# Patient Record
Sex: Female | Born: 2000
Health system: Southern US, Community
[De-identification: ages and names within clinical notes are randomized; demographics above are authoritative.]

## PROBLEM LIST (undated history)

## (undated) DIAGNOSIS — Z8489 Family history of other specified conditions: Secondary | ICD-10-CM

## (undated) DIAGNOSIS — T7840XA Allergy, unspecified, initial encounter: Secondary | ICD-10-CM

## (undated) DIAGNOSIS — T8859XA Other complications of anesthesia, initial encounter: Secondary | ICD-10-CM

## (undated) DIAGNOSIS — R51 Headache: Secondary | ICD-10-CM

## (undated) DIAGNOSIS — T4145XA Adverse effect of unspecified anesthetic, initial encounter: Secondary | ICD-10-CM

## (undated) DIAGNOSIS — R519 Headache, unspecified: Secondary | ICD-10-CM

## (undated) HISTORY — PX: CAUTERIZE INNER NOSE: SHX279

## (undated) HISTORY — DX: Allergy, unspecified, initial encounter: T78.40XA

## (undated) HISTORY — PX: TONSILLECTOMY: SUR1361

## (undated) HISTORY — PX: TYMPANOSTOMY TUBE PLACEMENT: SHX32

---
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
## 2022-02-12 ENCOUNTER — Ambulatory Visit: Payer: Managed Care, Other (non HMO) | Admitting: Nurse Practitioner

## 2022-02-12 ENCOUNTER — Encounter: Payer: Self-pay | Admitting: Nurse Practitioner

## 2022-02-12 VITALS — BP 126/82 | HR 115 | Temp 98.5°F | Wt 187.0 lb

## 2022-02-12 DIAGNOSIS — F419 Anxiety disorder, unspecified: Secondary | ICD-10-CM

## 2022-02-12 DIAGNOSIS — F32A Depression, unspecified: Secondary | ICD-10-CM | POA: Diagnosis not present

## 2022-02-12 MED ORDER — BUPROPION HCL ER (SR) 100 MG PO TB12: 100.0000 mg | ORAL_TABLET | Freq: Every day | ORAL | 0 refills | Status: DC

## 2022-02-12 MED ORDER — BUSPIRONE HCL 5 MG PO TABS: 5.0000 mg | ORAL_TABLET | Freq: Two times a day (BID) | ORAL | 0 refills | Status: DC | PRN

## 2022-02-12 NOTE — Assessment & Plan Note (Signed)
Chronic. Not well controlled.  Has a lot of medical concerns a going on.  Will add Wellbutrin to '100mg'$  daily.  Side effects and benefits of medication discussed during visit.  Will also add Buspar '5mg'$  BID PRN.  Side effects and benefits of medication discussed during visit today. Follow up in 1 month.  Call sooner if concerns arise.

## 2022-02-12 NOTE — Progress Notes (Signed)
BP 126/82   Pulse (!) 115   Temp 98.5 F (36.9 C) (Oral)   Wt 187 lb (84.8 kg)   SpO2 98%   BMI 34.20 kg/m    Subjective:    Patient ID: Katherine Ibarra, female    DOB: [birth year], 21 y.o.   MRN: [id number]  HPI: Katherine Ibarra is a 21 y.o. female  Chief Complaint  Patient presents with   Depression   DEPRESSION/ANXIETY Patient presents to clinic with complaints of depression and anxiety.  Has been seen at [facility] for Giant Cell Tumor and Neurofibromatosis.  Patient feels like her anxiety is worse.  She has a lot of highs and lows.   She was previously on Lexapro and Klonopin.  She is still taking Lexapro.  She was previously taking 1/2 tab maybe once a week.     Relevant past medical, surgical, family and social history reviewed and updated as indicated. Interim medical history since our last visit reviewed. Allergies and medications reviewed and updated.  Review of Systems  Psychiatric/Behavioral:  Positive for dysphoric mood. Negative for sleep disturbance and suicidal ideas. The patient is nervous/anxious.     Per HPI unless specifically indicated above     Objective:    BP 126/82   Pulse (!) 115   Temp 98.5 F (36.9 C) (Oral)   Wt 187 lb (84.8 kg)   SpO2 98%   BMI 34.20 kg/m   Wt Readings from Last 3 Encounters:  02/12/22 187 lb (84.8 kg) (96 %, Z= 1.75)*  01/06/21 162 lb (73.5 kg) (91 %, Z= 1.31)*  10/31/20 172 lb 2 oz (78.1 kg) (94 %, Z= 1.54)*   * Growth percentiles are based on CDC (Girls, 2-20 Years) data.    Physical Exam Vitals and nursing note reviewed.  Constitutional:      General: She is not in acute distress.    Appearance: Normal appearance. She is not ill-appearing, toxic-appearing or diaphoretic.  HENT:     Head: Normocephalic.     Right Ear: External ear normal.     Left Ear: External ear normal.     Nose: Nose normal.     Mouth/Throat:     Mouth: Mucous membranes are moist.     Pharynx: Oropharynx is clear.  Eyes:     General:         Right eye: No discharge.        Left eye: No discharge.     Extraocular Movements: Extraocular movements intact.     Conjunctiva/sclera: Conjunctivae normal.     Pupils: Pupils are equal, round, and reactive to light.  Cardiovascular:     Rate and Rhythm: Normal rate and regular rhythm.     Heart sounds: No murmur heard. Pulmonary:     Effort: Pulmonary effort is normal. No respiratory distress.     Breath sounds: Normal breath sounds. No wheezing or rales.  Musculoskeletal:     Cervical back: Normal range of motion and neck supple.  Skin:    General: Skin is warm and dry.     Capillary Refill: Capillary refill takes less than 2 seconds.  Neurological:     General: No focal deficit present.     Mental Status: She is alert and oriented to person, place, and time. Mental status is at baseline.  Psychiatric:        Mood and Affect: Mood normal.        Behavior: Behavior normal.  Thought Content: Thought content normal.        Judgment: Judgment normal.     Results for orders placed or performed in visit on 01/06/21  Rapid Strep Screen ([facility] ONLY)   Naso  Result Value Ref Range   Strep Gp A Ag, IA W/Reflex Negative Negative  Culture, Group A Strep   Naso  Result Value Ref Range   Strep A Culture Negative   Veritor Flu A/B Waived  Result Value Ref Range   Influenza A Negative Negative   Influenza B Negative Negative      Assessment & Plan:   Problem List Items Addressed This Visit       Other   Anxiety and depression - Primary    Chronic. Not well controlled.  Has a lot of medical concerns a going on.  Will add Wellbutrin to '100mg'$  daily.  Side effects and benefits of medication discussed during visit.  Will also add Buspar '5mg'$  BID PRN.  Side effects and benefits of medication discussed during visit today. Follow up in 1 month.  Call sooner if concerns arise.         Relevant Medications   buPROPion ER (WELLBUTRIN SR) 100 MG 12 hr tablet   busPIRone  (BUSPAR) 5 MG tablet     Follow up plan: Return in about 2 weeks (around 02/26/2022) for Depression/Anxiety FU.

## 2022-02-23 ENCOUNTER — Other Ambulatory Visit: Payer: Self-pay | Admitting: Nurse Practitioner

## 2022-02-23 NOTE — Telephone Encounter (Signed)
Requested medication (s) are due for refill today: No  Requested medication (s) are on the active medication list: Yes  Last refill:  02/12/22  Future visit scheduled: Yes  Notes to clinic:  Pharmacy requests 90 day supply.    Requested Prescriptions  Pending Prescriptions Disp Refills   busPIRone (BUSPAR) 5 MG tablet [Pharmacy Med Name: BUSPIRONE HCL 5 MG TABLET] 180 tablet 1    Sig: TAKE 1 TABLET BY MOUTH 2 TIMES DAILY AS NEEDED.     Psychiatry: Anxiolytics/Hypnotics - Non-controlled Passed - 02/23/2022  8:30 AM      Passed - Valid encounter within last 12 months    Recent Outpatient Visits           1 week ago Anxiety and depression   [facility] Jon Billings, NP   1 year ago Sore throat   [facility], Scheryl Darter, NP   1 year ago Immunization due   [facility] Jon Billings, NP   2 years ago Diarrhea, unspecified type   [facility], [place]   3 years ago Encounter for routine child health examination without abnormal findings   [facility], Lilia Argue, PA-C       Future Appointments             In 2 days Jon Billings, NP [facility], [place]

## 2022-02-24 NOTE — Progress Notes (Unsigned)
There were no vitals taken for this visit.   Subjective:    Patient ID: Katherine Ibarra, female    DOB: [birth year], 21 y.o.   MRN: [id number]  HPI: [name] is a 21 y.o. female  No chief complaint on file.  DEPRESSION/ANXIETY Patient presents to clinic with complaints of depression and anxiety.  Has been seen at [facility] for Giant Cell Tumor and Neurofibromatosis.  Patient feels like her anxiety is worse.  She has a lot of highs and lows.   She was previously on Lexapro and Klonopin.  She is still taking Lexapro.  She was previously taking 1/2 tab maybe once a week.     Relevant past medical, surgical, family and social history reviewed and updated as indicated. Interim medical history since our last visit reviewed. Allergies and medications reviewed and updated.  Review of Systems  Psychiatric/Behavioral:  Positive for dysphoric mood. Negative for sleep disturbance and suicidal ideas. The patient is nervous/anxious.     Per HPI unless specifically indicated above     Objective:    There were no vitals taken for this visit.  Wt Readings from Last 3 Encounters:  02/12/22 187 lb (84.8 kg) (96 %, Z= 1.75)*  01/06/21 162 lb (73.5 kg) (91 %, Z= 1.31)*  10/31/20 172 lb 2 oz (78.1 kg) (94 %, Z= 1.54)*   * Growth percentiles are based on CDC (Girls, 2-20 Years) data.    Physical Exam Vitals and nursing note reviewed.  Constitutional:      General: She is not in acute distress.    Appearance: Normal appearance. She is not ill-appearing, toxic-appearing or diaphoretic.  HENT:     Head: Normocephalic.     Right Ear: External ear normal.     Left Ear: External ear normal.     Nose: Nose normal.     Mouth/Throat:     Mouth: Mucous membranes are moist.     Pharynx: Oropharynx is clear.  Eyes:     General:        Right eye: No discharge.        Left eye: No discharge.     Extraocular Movements: Extraocular movements intact.     Conjunctiva/sclera: Conjunctivae normal.      Pupils: Pupils are equal, round, and reactive to light.  Cardiovascular:     Rate and Rhythm: Normal rate and regular rhythm.     Heart sounds: No murmur heard. Pulmonary:     Effort: Pulmonary effort is normal. No respiratory distress.     Breath sounds: Normal breath sounds. No wheezing or rales.  Musculoskeletal:     Cervical back: Normal range of motion and neck supple.  Skin:    General: Skin is warm and dry.     Capillary Refill: Capillary refill takes less than 2 seconds.  Neurological:     General: No focal deficit present.     Mental Status: She is alert and oriented to person, place, and time. Mental status is at baseline.  Psychiatric:        Mood and Affect: Mood normal.        Behavior: Behavior normal.        Thought Content: Thought content normal.        Judgment: Judgment normal.    Results for orders placed or performed in visit on 01/06/21  Rapid Strep Screen ([facility] ONLY)   Naso  Result Value Ref Range   Strep Gp A Ag, IA W/Reflex  Negative Negative  Culture, Group A Strep   Naso  Result Value Ref Range   Strep A Culture Negative   Veritor Flu A/B Waived  Result Value Ref Range   Influenza A Negative Negative   Influenza B Negative Negative      Assessment & Plan:   Problem List Items Addressed This Visit       Other   Anxiety and depression - Primary     Follow up plan: No follow-ups on file.

## 2022-02-25 ENCOUNTER — Encounter: Payer: Self-pay | Admitting: Nurse Practitioner

## 2022-02-25 ENCOUNTER — Ambulatory Visit: Payer: Managed Care, Other (non HMO) | Admitting: Nurse Practitioner

## 2022-02-25 VITALS — BP 119/82 | HR 109 | Temp 98.4°F | Wt 187.2 lb

## 2022-02-25 DIAGNOSIS — F32A Depression, unspecified: Secondary | ICD-10-CM

## 2022-02-25 DIAGNOSIS — F419 Anxiety disorder, unspecified: Secondary | ICD-10-CM

## 2022-02-25 MED ORDER — BUPROPION HCL ER (XL) 150 MG PO TB24: 150.0000 mg | ORAL_TABLET | Freq: Every day | ORAL | 0 refills | Status: DC

## 2022-02-25 NOTE — Assessment & Plan Note (Signed)
Chronic. Improved.  Will change Wellbutrin to '150mg'$  XL to help with the symptoms.  Recommend taking at night to help with fatigue.  Continue with Lexapro and Buspar PRN.  Follow up in 1 month.  Call sooner if concerns arise.

## 2022-03-29 NOTE — Progress Notes (Signed)
BP 134/89   Pulse (!) 109   Temp 99.4 F (37.4 C) (Oral)   Ht '5\' 3"'$  (1.6 m)   Wt 191 lb 11.2 oz (87 kg)   LMP 03/09/2022 (Approximate)   SpO2 98%   BMI 33.96 kg/m    Subjective:    Patient ID: Katherine Ibarra, female    DOB: 04-Sep-2000, 22 y.o.   MRN: [id number]  HPI: Katherine Ibarra is a 22 y.o. female  Chief Complaint  Patient presents with   Anxiety   Depression   DEPRESSION/ANXIETY Patient presents to clinic with complaints of depression and anxiety.  Has been seen at [facility] for Giant Cell Tumor and Neurofibromatosis.  Patient feels like she isn't doing well.  The last month she has been crying a lot and very emotional.  Her fatigue goes in spurts.  Denies SI.  She does feel like a burden to her family. But doesn't have any thoughts of harming herself.     Relevant past medical, surgical, family and social history reviewed and updated as indicated. Interim medical history since our last visit reviewed. Allergies and medications reviewed and updated.  Review of Systems  Psychiatric/Behavioral:  Positive for dysphoric mood. Negative for sleep disturbance and suicidal ideas. The patient is nervous/anxious.     Per HPI unless specifically indicated above     Objective:    BP 134/89   Pulse (!) 109   Temp 99.4 F (37.4 C) (Oral)   Ht '5\' 3"'$  (1.6 m)   Wt 191 lb 11.2 oz (87 kg)   LMP 03/09/2022 (Approximate)   SpO2 98%   BMI 33.96 kg/m   Wt Readings from Last 3 Encounters:  03/30/22 191 lb 11.2 oz (87 kg) (97 %, Z= 1.82)*  02/25/22 187 lb 3.2 oz (84.9 kg) (96 %, Z= 1.75)*  02/12/22 187 lb (84.8 kg) (96 %, Z= 1.75)*   * Growth percentiles are based on CDC (Girls, 2-20 Years) data.    Physical Exam Vitals and nursing note reviewed.  Constitutional:      General: She is not in acute distress.    Appearance: Normal appearance. She is not ill-appearing, toxic-appearing or diaphoretic.  HENT:     Head: Normocephalic.     Right Ear: External ear normal.     Left  Ear: External ear normal.     Nose: Nose normal.     Mouth/Throat:     Mouth: Mucous membranes are moist.     Pharynx: Oropharynx is clear.  Eyes:     General:        Right eye: No discharge.        Left eye: No discharge.     Extraocular Movements: Extraocular movements intact.     Conjunctiva/sclera: Conjunctivae normal.     Pupils: Pupils are equal, round, and reactive to light.  Cardiovascular:     Rate and Rhythm: Normal rate and regular rhythm.     Heart sounds: No murmur heard. Pulmonary:     Effort: Pulmonary effort is normal. No respiratory distress.     Breath sounds: Normal breath sounds. No wheezing or rales.  Musculoskeletal:     Cervical back: Normal range of motion and neck supple.  Skin:    General: Skin is warm and dry.     Capillary Refill: Capillary refill takes less than 2 seconds.  Neurological:     General: No focal deficit present.     Mental Status: She is alert and oriented to person,  place, and time. Mental status is at baseline.  Psychiatric:        Mood and Affect: Mood normal.        Behavior: Behavior normal.        Thought Content: Thought content normal.        Judgment: Judgment normal.     Results for orders placed or performed in visit on 01/06/21  Rapid Strep Screen ([facility] ONLY)   Naso  Result Value Ref Range   Strep Gp A Ag, IA W/Reflex Negative Negative  Culture, Group A Strep   Naso  Result Value Ref Range   Strep A Culture Negative   Veritor Flu A/B Waived  Result Value Ref Range   Influenza A Negative Negative   Influenza B Negative Negative      Assessment & Plan:   Problem List Items Addressed This Visit       Other   Anxiety and depression - Primary    Chronic. Not well controlled.  Will increase dose of Wellbutrin to '300mg'$  XL daily.  Will refer to therapy.  Continue with Buspar and Lexapro.  Follow up in 1 month.  Call sooner if concerns arise.       Relevant Medications   buPROPion (WELLBUTRIN XL) 300  MG 24 hr tablet   Other Relevant Orders   Ambulatory referral to Psychology     Follow up plan: Return in about 1 month (around 04/30/2022) for Depression/Anxiety FU.

## 2022-03-30 ENCOUNTER — Ambulatory Visit: Payer: Managed Care, Other (non HMO) | Admitting: Nurse Practitioner

## 2022-03-30 ENCOUNTER — Encounter: Payer: Self-pay | Admitting: Nurse Practitioner

## 2022-03-30 VITALS — BP 134/89 | HR 109 | Temp 99.4°F | Ht 63.0 in | Wt 191.7 lb

## 2022-03-30 DIAGNOSIS — F419 Anxiety disorder, unspecified: Secondary | ICD-10-CM

## 2022-03-30 DIAGNOSIS — F32A Depression, unspecified: Secondary | ICD-10-CM | POA: Diagnosis not present

## 2022-03-30 MED ORDER — BUPROPION HCL ER (XL) 300 MG PO TB24: 300.0000 mg | ORAL_TABLET | Freq: Every day | ORAL | 0 refills | Status: DC

## 2022-03-30 NOTE — Assessment & Plan Note (Signed)
Chronic. Not well controlled.  Will increase dose of Wellbutrin to '300mg'$  XL daily.  Will refer to therapy.  Continue with Buspar and Lexapro.  Follow up in 1 month.  Call sooner if concerns arise.

## 2022-04-29 NOTE — Progress Notes (Signed)
BP 130/83   Pulse 99   Temp 98.8 F (37.1 C) (Oral)   Wt 202 lb 3.2 oz (91.7 kg)   LMP 04/21/2022 (Approximate)   SpO2 98%   BMI 35.82 kg/m    Subjective:    Patient ID: Katherine Ibarra, female    DOB: Jun 13, 2000, 22 y.o.   MRN: [id number]  HPI: Katherine Ibarra is a 22 y.o. female  No chief complaint on file.  DEPRESSION/ANXIETY Patient presents she is feeling much better.  She is seeing a counselor to clinic with complaints of depression and anxiety.   She likes the Wellbutrin and the Lexapro '20mg'$ .   She is also taking Buspar everyday in the afternoon.  She was started on Trazodone which is helping her sleep. Denies SI.    [facility] Visit from 04/30/2022 in [facility]  PHQ-9 Total Score 11         04/30/2022    8:31 AM 03/30/2022   10:23 AM 02/12/2022   11:42 AM  GAD 7 : Generalized Anxiety Score  Nervous, Anxious, on Edge '1 3 3  '$ Control/stop worrying '1 3 3  '$ Worry too much - different things '1 3 3  '$ Trouble relaxing '2 3 3  '$ Restless '1 2 2  '$ Easily annoyed or irritable '2 3 3  '$ Afraid - awful might happen '2 3 3  '$ Total GAD 7 Score '10 20 20  '$ Anxiety Difficulty Somewhat difficult Somewhat difficult Somewhat difficult     Relevant past medical, surgical, family and social history reviewed and updated as indicated. Interim medical history since our last visit reviewed. Allergies and medications reviewed and updated.  Review of Systems  Psychiatric/Behavioral:  Positive for dysphoric mood. Negative for sleep disturbance and suicidal ideas. The patient is nervous/anxious.     Per HPI unless specifically indicated above     Objective:    BP 130/83   Pulse 99   Temp 98.8 F (37.1 C) (Oral)   Wt 202 lb 3.2 oz (91.7 kg)   LMP 04/21/2022 (Approximate)   SpO2 98%   BMI 35.82 kg/m   Wt Readings from Last 3 Encounters:  04/30/22 202 lb 3.2 oz (91.7 kg) (98 %, Z= 1.98)*  03/30/22 191 lb 11.2 oz (87 kg) (97 %, Z= 1.82)*  02/25/22 187 lb  3.2 oz (84.9 kg) (96 %, Z= 1.75)*   * Growth percentiles are based on CDC (Girls, 2-20 Years) data.    Physical Exam Vitals and nursing note reviewed.  Constitutional:      General: She is not in acute distress.    Appearance: Normal appearance. She is not ill-appearing, toxic-appearing or diaphoretic.  HENT:     Head: Normocephalic.     Right Ear: External ear normal.     Left Ear: External ear normal.     Nose: Nose normal.     Mouth/Throat:     Mouth: Mucous membranes are moist.     Pharynx: Oropharynx is clear.  Eyes:     General:        Right eye: No discharge.        Left eye: No discharge.     Extraocular Movements: Extraocular movements intact.     Conjunctiva/sclera: Conjunctivae normal.     Pupils: Pupils are equal, round, and reactive to light.  Cardiovascular:     Rate and Rhythm: Normal rate and regular rhythm.     Heart sounds: No murmur heard. Pulmonary:     Effort:  Pulmonary effort is normal. No respiratory distress.     Breath sounds: Normal breath sounds. No wheezing or rales.  Musculoskeletal:     Cervical back: Normal range of motion and neck supple.  Skin:    General: Skin is warm and dry.     Capillary Refill: Capillary refill takes less than 2 seconds.  Neurological:     General: No focal deficit present.     Mental Status: She is alert and oriented to person, place, and time. Mental status is at baseline.  Psychiatric:        Mood and Affect: Mood normal.        Behavior: Behavior normal.        Thought Content: Thought content normal.        Judgment: Judgment normal.     Results for orders placed or performed in visit on 01/06/21  Rapid Strep Screen ([facility] ONLY)   Naso  Result Value Ref Range   Strep Gp A Ag, IA W/Reflex Negative Negative  Culture, Group A Strep   Naso  Result Value Ref Range   Strep A Culture Negative   Veritor Flu A/B Waived  Result Value Ref Range   Influenza A Negative Negative   Influenza B Negative  Negative      Assessment & Plan:   Problem List Items Addressed This Visit       Other   Anxiety and depression - Primary    Chronic.  Improved.  Doing well with Wellbutrin, Lexapro and Trazodone at bedtime.  Uses Buspar '5mg'$  in the afternoon.  Feels like it could be increased.  Will increase to Buspar '10mg'$ .  Follow up in 2 months.  Continue to see counselor.  Call sooner if concerns arise.       Relevant Medications   traZODone (DESYREL) 50 MG tablet   escitalopram (LEXAPRO) 20 MG tablet   buPROPion (WELLBUTRIN XL) 300 MG 24 hr tablet   busPIRone (BUSPAR) 5 MG tablet     Follow up plan: Return in about 2 months (around 06/29/2022) for Depression/Anxiety FU.

## 2022-04-30 ENCOUNTER — Ambulatory Visit: Payer: Managed Care, Other (non HMO) | Admitting: Nurse Practitioner

## 2022-04-30 ENCOUNTER — Encounter: Payer: Self-pay | Admitting: Nurse Practitioner

## 2022-04-30 VITALS — BP 130/83 | HR 99 | Temp 98.8°F | Wt 202.2 lb

## 2022-04-30 DIAGNOSIS — F32A Depression, unspecified: Secondary | ICD-10-CM | POA: Diagnosis not present

## 2022-04-30 DIAGNOSIS — F419 Anxiety disorder, unspecified: Secondary | ICD-10-CM | POA: Diagnosis not present

## 2022-04-30 MED ORDER — ESCITALOPRAM OXALATE 20 MG PO TABS: 20.0000 mg | ORAL_TABLET | Freq: Every day | ORAL | 1 refills | Status: DC

## 2022-04-30 MED ORDER — BUPROPION HCL ER (XL) 300 MG PO TB24: 300.0000 mg | ORAL_TABLET | Freq: Every day | ORAL | 1 refills | Status: DC

## 2022-04-30 MED ORDER — BUSPIRONE HCL 5 MG PO TABS: 10.0000 mg | ORAL_TABLET | Freq: Two times a day (BID) | ORAL | 1 refills | Status: DC

## 2022-04-30 NOTE — Assessment & Plan Note (Signed)
Chronic.  Improved.  Doing well with Wellbutrin, Lexapro and Trazodone at bedtime.  Uses Buspar '5mg'$  in the afternoon.  Feels like it could be increased.  Will increase to Buspar '10mg'$ .  Follow up in 2 months.  Continue to see counselor.  Call sooner if concerns arise.

## 2022-06-03 ENCOUNTER — Ambulatory Visit (INDEPENDENT_AMBULATORY_CARE_PROVIDER_SITE_OTHER): Payer: Managed Care, Other (non HMO) | Admitting: Nurse Practitioner

## 2022-06-03 ENCOUNTER — Encounter: Payer: Self-pay | Admitting: Nurse Practitioner

## 2022-06-03 VITALS — BP 119/85 | HR 89 | Temp 97.9°F | Ht 63.0 in | Wt 201.5 lb

## 2022-06-03 DIAGNOSIS — Q8501 Neurofibromatosis, type 1: Secondary | ICD-10-CM | POA: Diagnosis not present

## 2022-06-03 DIAGNOSIS — F32A Depression, unspecified: Secondary | ICD-10-CM

## 2022-06-03 DIAGNOSIS — F419 Anxiety disorder, unspecified: Secondary | ICD-10-CM | POA: Diagnosis not present

## 2022-06-03 DIAGNOSIS — Z1159 Encounter for screening for other viral diseases: Secondary | ICD-10-CM

## 2022-06-03 DIAGNOSIS — Z114 Encounter for screening for human immunodeficiency virus [HIV]: Secondary | ICD-10-CM

## 2022-06-03 DIAGNOSIS — Z0000 Encounter for general adult medical examination without abnormal findings: Secondary | ICD-10-CM | POA: Diagnosis not present

## 2022-06-03 DIAGNOSIS — Z23 Encounter for immunization: Secondary | ICD-10-CM

## 2022-06-03 DIAGNOSIS — R8290 Unspecified abnormal findings in urine: Secondary | ICD-10-CM

## 2022-06-03 DIAGNOSIS — Z136 Encounter for screening for cardiovascular disorders: Secondary | ICD-10-CM

## 2022-06-03 LAB — MICROSCOPIC EXAMINATION

## 2022-06-03 LAB — URINALYSIS, ROUTINE W REFLEX MICROSCOPIC
Bilirubin, UA: NEGATIVE
Glucose, UA: NEGATIVE
Ketones, UA: NEGATIVE
Nitrite, UA: POSITIVE — AB
Protein,UA: NEGATIVE
Specific Gravity, UA: >1.03 — ABNORMAL HIGH (ref 1.005–1.030)
Urobilinogen, Ur: 0.2 mg/dL (ref 0.2–1.0)
pH, UA: 5 (ref 5.0–7.5)

## 2022-06-03 NOTE — Progress Notes (Signed)
BP 119/85   Pulse 89   Temp 97.9 F (36.6 C) (Oral)   Ht 5\' 3"  (1.6 m)   Wt 201 lb 8 oz (91.4 kg)   LMP 05/13/2022 (Approximate)   SpO2 98%   BMI 35.69 kg/m    Subjective:    Patient ID: Katherine Ibarra, female    DOB: [birth year], 22 y.o.   MRN: [id number]  HPI: Katherine Ibarra is a 22 y.o. female presenting on 06/03/2022 for comprehensive medical examination. Current medical complaints include: depression/anxiety  She currently lives with: Mom, Dad and sister Menopausal Symptoms: no  Patient states her anxiety has been more of a problem recently than depression.  However, she feels like she is doing well and doesn't want to change her medications at this time.  Depression Screen done today and results listed below:     06/03/2022    8:54 AM 04/30/2022    8:31 AM 03/30/2022   10:23 AM 02/12/2022   11:41 AM 10/31/2020    3:33 PM  Depression screen PHQ 2/9  Decreased Interest 1 1 3 2  0  Down, Depressed, Hopeless 1 1 3 3  0  PHQ - 2 Score 2 2 6 5  0  Altered sleeping 0 1 3 3    Tired, decreased energy 0 2 3 3    Change in appetite 1 2 3 2    Feeling bad or failure about yourself  1 2 3 1    Trouble concentrating 0 1 3 0   Moving slowly or fidgety/restless 0 1 1 0   Suicidal thoughts 0 0 1 0   PHQ-9 Score 4 11 23 14    Difficult doing work/chores Not difficult at all Not difficult at all Somewhat difficult Somewhat difficult     The patient does a history of falls. I did complete a risk assessment for falls. A plan of care for falls was documented.   Past Medical History:  Past Medical History:  Diagnosis Date   Allergy    Seasonal   Complication of anesthesia    PT WOKE UP FROM TONSILLECTOMY VERY COMBATIVE PER MOM-PT WAS AGE 18   Family history of adverse reaction to anesthesia    MOM-N/V   Headache    MIGRAINES    Surgical History:  Past Surgical History:  Procedure Laterality Date   CAUTERIZE INNER NOSE     X4   EXCISION MASS LOWER EXTREMETIES Left 06/24/2016    Procedure: EXCISION MASS LOWER EXTREMETIES WITH BIOPSIES;  Surgeon: Clayburn Pert, MD;  Location: [facility] ORS;  Service: General;  Laterality: Left;   TONSILLECTOMY     AGE 18   TYMPANOSTOMY TUBE PLACEMENT      Medications:  Current Outpatient Medications on File Prior to Visit  Medication Sig   buPROPion (WELLBUTRIN XL) 300 MG 24 hr tablet Take 1 tablet (300 mg total) by mouth daily.   busPIRone (BUSPAR) 5 MG tablet Take 2 tablets (10 mg total) by mouth 2 (two) times daily.   escitalopram (LEXAPRO) 20 MG tablet Take 1 tablet (20 mg total) by mouth daily.   gabapentin (NEURONTIN) 300 MG capsule Take 300 mg by mouth 3 (three) times daily.   ibuprofen (ADVIL,MOTRIN) 200 MG tablet Take 200 mg by mouth every 6 (six) hours as needed for headache or moderate pain.   traZODone (DESYREL) 50 MG tablet Take 50 mg by mouth at bedtime.   No current facility-administered medications on file prior to visit.    Allergies:  No Known Allergies  Social  History:  Social History   Socioeconomic History   Marital status: Single    Spouse name: Not on file   Number of children: Not on file   Years of education: Not on file   Highest education level: Not on file  Occupational History   Not on file  Tobacco Use   Smoking status: Never    Passive exposure: Yes   Smokeless tobacco: Never   Tobacco comments:    Mom and dad smoke outside  Vaping Use   Vaping Use: Never used  Substance and Sexual Activity   Alcohol use: No    Alcohol/week: 0.0 standard drinks of alcohol   Drug use: No   Sexual activity: Never  Other Topics Concern   Not on file  Social History Narrative   Not on file   Social Determinants of Health   Financial Resource Strain: Not on file  Food Insecurity: Not on file  Transportation Needs: Not on file  Physical Activity: Not on file  Stress: Not on file  Social Connections: Not on file  Intimate Partner Violence: Not on file   Social History   Tobacco Use  Smoking  Status Never   Passive exposure: Yes  Smokeless Tobacco Never  Tobacco Comments   Mom and dad smoke outside   Social History   Substance and Sexual Activity  Alcohol Use No   Alcohol/week: 0.0 standard drinks of alcohol    Family History:  Family History  Problem Relation Age of Onset   Migraines Mother    Migraines Father     Past medical history, surgical history, medications, allergies, family history and social history reviewed with patient today and changes made to appropriate areas of the chart.   Review of Systems  Psychiatric/Behavioral:  Positive for depression. Negative for suicidal ideas. The patient is nervous/anxious.    All other ROS negative except what is listed above and in the HPI.      Objective:    BP 119/85   Pulse 89   Temp 97.9 F (36.6 C) (Oral)   Ht 5\' 3"  (1.6 m)   Wt 201 lb 8 oz (91.4 kg)   LMP 05/13/2022 (Approximate)   SpO2 98%   BMI 35.69 kg/m   Wt Readings from Last 3 Encounters:  06/03/22 201 lb 8 oz (91.4 kg) (98 %, Z= 1.97)*  04/30/22 202 lb 3.2 oz (91.7 kg) (98 %, Z= 1.98)*  03/30/22 191 lb 11.2 oz (87 kg) (97 %, Z= 1.82)*   * Growth percentiles are based on CDC (Girls, 2-20 Years) data.    Physical Exam Vitals and nursing note reviewed.  Constitutional:      General: She is awake. She is not in acute distress.    Appearance: Normal appearance. She is well-developed. She is not ill-appearing.  HENT:     Head: Normocephalic and atraumatic.     Right Ear: Hearing, tympanic membrane, ear canal and external ear normal. No drainage.     Left Ear: Hearing, tympanic membrane, ear canal and external ear normal. No drainage.     Nose: Nose normal.     Right Sinus: No maxillary sinus tenderness or frontal sinus tenderness.     Left Sinus: No maxillary sinus tenderness or frontal sinus tenderness.     Mouth/Throat:     Mouth: Mucous membranes are moist.     Pharynx: Oropharynx is clear. Uvula midline. No pharyngeal swelling,  oropharyngeal exudate or posterior oropharyngeal erythema.  Eyes:  General: Lids are normal.        Right eye: No discharge.        Left eye: No discharge.     Extraocular Movements: Extraocular movements intact.     Conjunctiva/sclera: Conjunctivae normal.     Pupils: Pupils are equal, round, and reactive to light.     Visual Fields: Right eye visual fields normal and left eye visual fields normal.  Neck:     Thyroid: No thyromegaly.     Vascular: No carotid bruit.     Trachea: Trachea normal.  Cardiovascular:     Rate and Rhythm: Normal rate and regular rhythm.     Heart sounds: Normal heart sounds. No murmur heard.    No gallop.  Pulmonary:     Effort: Pulmonary effort is normal. No accessory muscle usage or respiratory distress.     Breath sounds: Normal breath sounds.  Chest:  Breasts:    Right: Normal.     Left: Normal.  Abdominal:     General: Bowel sounds are normal.     Palpations: Abdomen is soft. There is no hepatomegaly or splenomegaly.     Tenderness: There is no abdominal tenderness.  Musculoskeletal:        General: Normal range of motion.     Cervical back: Normal range of motion and neck supple.     Right lower leg: No edema.     Left lower leg: No edema.  Lymphadenopathy:     Head:     Right side of head: No submental, submandibular, tonsillar, preauricular or posterior auricular adenopathy.     Left side of head: No submental, submandibular, tonsillar, preauricular or posterior auricular adenopathy.     Cervical: No cervical adenopathy.     Upper Body:     Right upper body: No supraclavicular, axillary or pectoral adenopathy.     Left upper body: No supraclavicular, axillary or pectoral adenopathy.  Skin:    General: Skin is warm and dry.     Capillary Refill: Capillary refill takes less than 2 seconds.     Findings: No rash.  Neurological:     Mental Status: She is alert and oriented to person, place, and time.     Gait: Gait is intact.   Psychiatric:        Attention and Perception: Attention normal.        Mood and Affect: Mood normal.        Speech: Speech normal.        Behavior: Behavior normal. Behavior is cooperative.        Thought Content: Thought content normal.        Judgment: Judgment normal.     Results for orders placed or performed in visit on 01/06/21  Rapid Strep Screen ([facility] ONLY)   Naso  Result Value Ref Range   Strep Gp A Ag, IA W/Reflex Negative Negative  Culture, Group A Strep   Naso  Result Value Ref Range   Strep A Culture Negative   Veritor Flu A/B Waived  Result Value Ref Range   Influenza A Negative Negative   Influenza B Negative Negative      Assessment & Plan:   Problem List Items Addressed This Visit       Nervous and Auditory   Neurofibromatosis, type 1 (HCC)    Chronic.  Followed by heme/onc specialist.  Had recent scan which gave good news.  Continue to follow up with specialist.  Other   Anxiety and depression    Chronic.  Controlled.  Doing well with Wellbutrin, Lexapro and Trazodone at bedtime.  Uses Buspar 10mg  in the afternoon.  Follow up in 3 months.  Continue to see counselor.  Call sooner if concerns arise.       Other Visit Diagnoses     Annual physical exam    -  Primary   Health maintenance reviewed during visit today.  Labs ordered.  HPV shot given.   Relevant Orders   CBC with Differential/Platelet   Comprehensive metabolic panel   Lipid panel   TSH   Urinalysis, Routine w reflex microscopic   HIV Antibody (routine testing w rflx)   Hepatitis C Antibody   Screening for HIV (human immunodeficiency virus)       Relevant Orders   HIV Antibody (routine testing w rflx)   Encounter for hepatitis C screening test for low risk patient       Relevant Orders   Hepatitis C Antibody   Screening for ischemic heart disease       Relevant Orders   Lipid panel   Need for HPV vaccination       Relevant Orders   HPV 9-valent  vaccine,Recombinat        Follow up plan: Return in about 3 months (around 09/03/2022) for HTN, HLD, DM2 FU.   LABORATORY TESTING:  - Pap smear: not applicable  IMMUNIZATIONS:   - Tdap: Tetanus vaccination status reviewed: last tetanus booster within 10 years. - Influenza: Refused - Pneumovax: Not applicable - Prevnar: Not applicable - COVID: Not applicable - HPV: Administered today - Shingrix vaccine: Not applicable  SCREENING: -Mammogram: Not applicable  - Colonoscopy: Not applicable  - Bone Density: Not applicable  -Hearing Test: Not applicable  -Spirometry: Not applicable   PATIENT COUNSELING:   Advised to take 1 mg of folate supplement per day if capable of pregnancy.   Sexuality: Discussed sexually transmitted diseases, partner selection, use of condoms, avoidance of unintended pregnancy  and contraceptive alternatives.   Advised to avoid cigarette smoking.  I discussed with the patient that most people either abstain from alcohol or drink within safe limits (<=14/week and <=4 drinks/occasion for males, <=7/weeks and <= 3 drinks/occasion for females) and that the risk for alcohol disorders and other health effects rises proportionally with the number of drinks per week and how often a drinker exceeds daily limits.  Discussed cessation/primary prevention of drug use and availability of treatment for abuse.   Diet: Encouraged to adjust caloric intake to maintain  or achieve ideal body weight, to reduce intake of dietary saturated fat and total fat, to limit sodium intake by avoiding high sodium foods and not adding table salt, and to maintain adequate dietary potassium and calcium preferably from fresh fruits, vegetables, and low-fat dairy products.    stressed the importance of regular exercise  Injury prevention: Discussed safety belts, safety helmets, smoke detector, smoking near bedding or upholstery.   Dental health: Discussed importance of regular tooth brushing,  flossing, and dental visits.    NEXT PREVENTATIVE PHYSICAL DUE IN 1 YEAR. Return in about 3 months (around 09/03/2022) for HTN, HLD, DM2 FU.

## 2022-06-03 NOTE — Assessment & Plan Note (Signed)
Chronic.  Followed by heme/onc specialist.  Had recent scan which gave good news.  Continue to follow up with specialist.

## 2022-06-03 NOTE — Assessment & Plan Note (Signed)
Chronic.  Controlled.  Doing well with Wellbutrin, Lexapro and Trazodone at bedtime.  Uses Buspar 10mg  in the afternoon.  Follow up in 3 months.  Continue to see counselor.  Call sooner if concerns arise.

## 2022-06-03 NOTE — Addendum Note (Signed)
Addended by: Jon Billings on: 06/03/2022 11:58 AM   Modules accepted: Orders

## 2022-06-04 LAB — COMPREHENSIVE METABOLIC PANEL
ALT: 19 IU/L (ref 0–32)
AST: 15 IU/L (ref 0–40)
Albumin/Globulin Ratio: 2.3 — ABNORMAL HIGH (ref 1.2–2.2)
Albumin: 4.4 g/dL (ref 4.0–5.0)
Alkaline Phosphatase: 84 IU/L (ref 42–106)
BUN/Creatinine Ratio: 22 (ref 9–23)
BUN: 18 mg/dL (ref 6–20)
Bilirubin Total: 0.3 mg/dL (ref 0.0–1.2)
CO2: 18 mmol/L — ABNORMAL LOW (ref 20–29)
Calcium: 9.2 mg/dL (ref 8.7–10.2)
Chloride: 106 mmol/L (ref 96–106)
Creatinine, Ser: 0.81 mg/dL (ref 0.57–1.00)
Globulin, Total: 1.9 g/dL (ref 1.5–4.5)
Glucose: 92 mg/dL (ref 70–99)
Potassium: 4.2 mmol/L (ref 3.5–5.2)
Sodium: 139 mmol/L (ref 134–144)
Total Protein: 6.3 g/dL (ref 6.0–8.5)
eGFR: 107 mL/min/{1.73_m2} (ref >59)

## 2022-06-04 LAB — LIPID PANEL
Chol/HDL Ratio: 2.8 ratio (ref 0.0–4.4)
Cholesterol, Total: 128 mg/dL (ref 100–169)
HDL: 46 mg/dL (ref >39)
LDL Chol Calc (NIH): 67 mg/dL (ref 0–109)
Triglycerides: 73 mg/dL (ref 0–89)
VLDL Cholesterol Cal: 15 mg/dL (ref 5–40)

## 2022-06-04 LAB — CBC WITH DIFFERENTIAL/PLATELET
Basophils Absolute: 0.1 10*3/uL (ref 0.0–0.2)
Basos: 1 %
EOS (ABSOLUTE): 0.1 10*3/uL (ref 0.0–0.4)
Eos: 1 %
Hematocrit: 42.4 % (ref 34.0–46.6)
Hemoglobin: 14.3 g/dL (ref 11.1–15.9)
Immature Grans (Abs): 0 10*3/uL (ref 0.0–0.1)
Immature Granulocytes: 0 %
Lymphocytes Absolute: 1.2 10*3/uL (ref 0.7–3.1)
Lymphs: 18 %
MCH: 32.1 pg (ref 26.6–33.0)
MCHC: 33.7 g/dL (ref 31.5–35.7)
MCV: 95 fL (ref 79–97)
Monocytes Absolute: 0.5 10*3/uL (ref 0.1–0.9)
Monocytes: 8 %
Neutrophils Absolute: 4.7 10*3/uL (ref 1.4–7.0)
Neutrophils: 72 %
Platelets: 201 10*3/uL (ref 150–450)
RBC: 4.46 x10E6/uL (ref 3.77–5.28)
RDW: 11.7 % (ref 11.7–15.4)
WBC: 6.6 10*3/uL (ref 3.4–10.8)

## 2022-06-04 LAB — HEPATITIS C ANTIBODY: Hep C Virus Ab: NONREACTIVE

## 2022-06-04 LAB — HIV ANTIBODY (ROUTINE TESTING W REFLEX): HIV Screen 4th Generation wRfx: NONREACTIVE

## 2022-06-04 LAB — TSH: TSH: 4.43 u[IU]/mL (ref 0.450–4.500)

## 2022-06-04 NOTE — Progress Notes (Signed)
Hi Katherine Ibarra. It was nice to see you yesterday.  Your lab work looks good.  No concerns at this time. Continue with your current medication regimen.  Follow up as discussed.  Please let me know if you have any questions.

## 2022-06-06 LAB — URINE CULTURE

## 2022-06-07 ENCOUNTER — Other Ambulatory Visit: Payer: Self-pay | Admitting: Family Medicine

## 2022-06-07 MED ORDER — NITROFURANTOIN MONOHYD MACRO 100 MG PO CAPS: 100.0000 mg | ORAL_CAPSULE | Freq: Two times a day (BID) | ORAL | 0 refills | Status: DC

## 2022-06-14 ENCOUNTER — Other Ambulatory Visit: Payer: Self-pay | Admitting: Nurse Practitioner

## 2022-06-15 NOTE — Telephone Encounter (Signed)
Requested Prescriptions  Pending Prescriptions Disp Refills   busPIRone (BUSPAR) 5 MG tablet [Pharmacy Med Name: BUSPIRONE HCL 5 MG TABLET] 180 tablet 1    Sig: TAKE 1 TABLET BY MOUTH TWICE A DAY AS NEEDED     Psychiatry: Anxiolytics/Hypnotics - Non-controlled Passed - 06/14/2022  4:26 PM      Passed - Valid encounter within last 12 months    Recent Outpatient Visits           1 week ago Annual physical exam   [place] [facility] Larae Grooms, NP   1 month ago Anxiety and depression   [place] [facility] Larae Grooms, NP   2 months ago Anxiety and depression   [place] [facility] Larae Grooms, NP   3 months ago Anxiety and depression   [place] [facility] Larae Grooms, NP   4 months ago Anxiety and depression   [place] [facility] Larae Grooms, NP       Future Appointments             In 2 weeks Larae Grooms, NP [place] [facility]   In 2 months Larae Grooms, NP [place] [facility]

## 2022-06-17 ENCOUNTER — Telehealth: Payer: Managed Care, Other (non HMO) | Admitting: Nurse Practitioner

## 2022-06-17 ENCOUNTER — Encounter: Payer: Self-pay | Admitting: Nurse Practitioner

## 2022-06-17 DIAGNOSIS — B379 Candidiasis, unspecified: Secondary | ICD-10-CM

## 2022-06-17 MED ORDER — FLUCONAZOLE 150 MG PO TABS: 150.0000 mg | ORAL_TABLET | Freq: Once | ORAL | 0 refills | Status: AC

## 2022-06-17 NOTE — Progress Notes (Signed)
LMP 05/13/2022 (Approximate)    Subjective:    Patient ID: Katherine Ibarra, female    DOB: 10/22/2000, 22 y.o.   MRN: [id number]  HPI: Katherine Ibarra is a 22 y.o. female  Chief Complaint  Patient presents with   Vaginitis    Recently given ABX, now having vaginal discomfort, started 4 days ago, has gotten worse   VAGINAL CONCERN Patient states she was recently given antibiotics and started having vaginal discomfort.  She is having itching but no discharge.  Relevant past medical, surgical, family and social history reviewed and updated as indicated. Interim medical history since our last visit reviewed. Allergies and medications reviewed and updated.  Review of Systems  Genitourinary:        Vaginal discomfort    Per HPI unless specifically indicated above     Objective:    LMP 05/13/2022 (Approximate)   Wt Readings from Last 3 Encounters:  06/03/22 201 lb 8 oz (91.4 kg) (98 %, Z= 1.97)*  04/30/22 202 lb 3.2 oz (91.7 kg) (98 %, Z= 1.98)*  03/30/22 191 lb 11.2 oz (87 kg) (97 %, Z= 1.82)*   * Growth percentiles are based on CDC (Girls, 2-20 Years) data.    Physical Exam Vitals and nursing note reviewed.  HENT:     Head: Normocephalic.     Right Ear: Hearing normal.     Left Ear: Hearing normal.     Nose: Nose normal.  Eyes:     Pupils: Pupils are equal, round, and reactive to light.  Pulmonary:     Effort: Pulmonary effort is normal. No respiratory distress.  Neurological:     Mental Status: She is alert.  Psychiatric:        Mood and Affect: Mood normal.        Behavior: Behavior normal.        Thought Content: Thought content normal.        Judgment: Judgment normal.     Results for orders placed or performed in visit on 06/03/22  Microscopic Examination   Urine  Result Value Ref Range   WBC, UA 6-10 (A) 0 - 5 /hpf   RBC, Urine 0-2 0 - 2 /hpf   Epithelial Cells (non renal) 0-10 0 - 10 /hpf   Bacteria, UA Many (A) None seen/Few  Urine Culture    Specimen: Urine   UR  Result Value Ref Range   Urine Culture, Routine Final report (A)    Organism ID, Bacteria Escherichia coli (A)    Antimicrobial Susceptibility Comment   CBC with Differential/Platelet  Result Value Ref Range   WBC 6.6 3.4 - 10.8 x10E3/uL   RBC 4.46 3.77 - 5.28 x10E6/uL   Hemoglobin 14.3 11.1 - 15.9 g/dL   Hematocrit 26.3 78.5 - 46.6 %   MCV 95 79 - 97 fL   MCH 32.1 26.6 - 33.0 pg   MCHC 33.7 31.5 - 35.7 g/dL   RDW 88.5 02.7 - 74.1 %   Platelets 201 150 - 450 x10E3/uL   Neutrophils 72 Not Estab. %   Lymphs 18 Not Estab. %   Monocytes 8 Not Estab. %   Eos 1 Not Estab. %   Basos 1 Not Estab. %   Neutrophils Absolute 4.7 1.4 - 7.0 x10E3/uL   Lymphocytes Absolute 1.2 0.7 - 3.1 x10E3/uL   Monocytes Absolute 0.5 0.1 - 0.9 x10E3/uL   EOS (ABSOLUTE) 0.1 0.0 - 0.4 x10E3/uL   Basophils Absolute 0.1 0.0 - 0.2 x10E3/uL  Immature Granulocytes 0 Not Estab. %   Immature Grans (Abs) 0.0 0.0 - 0.1 x10E3/uL  Comprehensive metabolic panel  Result Value Ref Range   Glucose 92 70 - 99 mg/dL   BUN 18 6 - 20 mg/dL   Creatinine, Ser 5.780.81 0.57 - 1.00 mg/dL   eGFR 469107 >62>59 XB/MWU/1.32mL/min/1.73   BUN/Creatinine Ratio 22 9 - 23   Sodium 139 134 - 144 mmol/L   Potassium 4.2 3.5 - 5.2 mmol/L   Chloride 106 96 - 106 mmol/L   CO2 18 (L) 20 - 29 mmol/L   Calcium 9.2 8.7 - 10.2 mg/dL   Total Protein 6.3 6.0 - 8.5 g/dL   Albumin 4.4 4.0 - 5.0 g/dL   Globulin, Total 1.9 1.5 - 4.5 g/dL   Albumin/Globulin Ratio 2.3 (H) 1.2 - 2.2   Bilirubin Total 0.3 0.0 - 1.2 mg/dL   Alkaline Phosphatase 84 42 - 106 IU/L   AST 15 0 - 40 IU/L   ALT 19 0 - 32 IU/L  Lipid panel  Result Value Ref Range   Cholesterol, Total 128 100 - 169 mg/dL   Triglycerides 73 0 - 89 mg/dL   HDL 46 >44>39 mg/dL   VLDL Cholesterol Cal 15 5 - 40 mg/dL   LDL Chol Calc (NIH) 67 0 - 109 mg/dL   Chol/HDL Ratio 2.8 0.0 - 4.4 ratio  TSH  Result Value Ref Range   TSH 4.430 0.450 - 4.500 uIU/mL  Urinalysis, Routine w reflex  microscopic  Result Value Ref Range   Specific Gravity, UA >1.030 (H) 1.005 - 1.030   pH, UA 5.0 5.0 - 7.5   Color, UA Yellow Yellow   Appearance Ur Cloudy (A) Clear   Leukocytes,UA Trace (A) Negative   Protein,UA Negative Negative/Trace   Glucose, UA Negative Negative   Ketones, UA Negative Negative   RBC, UA 1+ (A) Negative   Bilirubin, UA Negative Negative   Urobilinogen, Ur 0.2 0.2 - 1.0 mg/dL   Nitrite, UA Positive (A) Negative   Microscopic Examination See below:   HIV Antibody (routine testing w rflx)  Result Value Ref Range   HIV Screen 4th Generation wRfx Non Reactive Non Reactive  Hepatitis C Antibody  Result Value Ref Range   Hep C Virus Ab Non Reactive Non Reactive      Assessment & Plan:   Problem List Items Addressed This Visit   None Visit Diagnoses     Yeast infection    -  Primary   Will treat with Diflucan. Recommend taking 1 pill today and 1 pill 2 days after completing the antibiotics.   Relevant Medications   fluconazole (DIFLUCAN) 150 MG tablet        Follow up plan: No follow-ups on file.   This visit was completed via MyChart due to the restrictions of the COVID-19 pandemic. All issues as above were discussed and addressed. Physical exam was done as above through visual confirmation on MyChart. If it was felt that the patient should be evaluated in the office, they were directed there. The patient verbally consented to this visit. Location of the patient: Home Location of the provider: Office Those involved with this call:  Provider: Larae GroomsKaren Elke Holtry, NP CMA: Wilhemena DurieBrittany Russell, CMA Front Desk/Registration: Servando SnareIris Parrish This encounter was conducted via video.  I spent 20 dedicated to the care of this patient on the date of this encounter to include previsit review of symptoms, plan of care and follow up, face to face time with the  patient, and post visit ordering of testing.

## 2022-06-28 NOTE — Progress Notes (Deleted)
LMP 05/13/2022 (Approximate)    Subjective:    Patient ID: Katherine Ibarra, female    DOB: March 31, 2000, 22 y.o.   MRN: [id number]  HPI: Katherine Ibarra is a 22 y.o. female  No chief complaint on file.  DEPRESSION/ANXIETY Patient presents she is feeling much better.  She is seeing a counselor to clinic with complaints of depression and anxiety.   She likes the Wellbutrin and the Lexapro .   She is also taking Buspar everyday in the afternoon.  She was started on Trazodone which is helping her sleep. Denies SI.    Flowsheet Row Video Visit from 06/17/2022 in [facility]  PHQ-9 Total Score 7         06/17/2022    4:10 PM 06/03/2022    8:54 AM 04/30/2022    8:31 AM 03/30/2022   10:23 AM  GAD 7 : Generalized Anxiety Score  Nervous, Anxious, on Edge Control/stop worrying Worry too much - different things 0 Trouble relaxing Restless 0 0 1 2  Easily annoyed or irritable Afraid - awful might happen Total GAD 7 Score Anxiety Difficulty Somewhat difficult Somewhat difficult Somewhat difficult Somewhat difficult     Relevant past medical, surgical, family and social history reviewed and updated as indicated. Interim medical history since our last visit reviewed. Allergies and medications reviewed and updated.  Review of Systems  Psychiatric/Behavioral:  Positive for dysphoric mood. Negative for sleep disturbance and suicidal ideas. The patient is nervous/anxious.     Per HPI unless specifically indicated above     Objective:    LMP 05/13/2022 (Approximate)   Wt Readings from Last 3 Encounters:  06/03/22 201 lb 8 oz (91.4 kg) (98 %, Z= 1.97)*  04/30/22 202 lb 3.2 oz (91.7 kg) (98 %, Z= 1.98)*  03/30/22 191 lb 11.2 oz (87 kg) (97 %, Z= 1.82)*   * Growth percentiles are based on CDC (Girls, 2-20 Years) data.    Physical Exam Vitals and nursing note reviewed.  Constitutional:       General: She is not in acute distress.    Appearance: Normal appearance. She is not ill-appearing, toxic-appearing or diaphoretic.  HENT:     Head: Normocephalic.     Right Ear: External ear normal.     Left Ear: External ear normal.     Nose: Nose normal.     Mouth/Throat:     Mouth: Mucous membranes are moist.     Pharynx: Oropharynx is clear.  Eyes:     General:        Right eye: No discharge.        Left eye: No discharge.     Extraocular Movements: Extraocular movements intact.     Conjunctiva/sclera: Conjunctivae normal.     Pupils: Pupils are equal, round, and reactive to light.  Cardiovascular:     Rate and Rhythm: Normal rate and regular rhythm.     Heart sounds: No murmur heard. Pulmonary:     Effort: Pulmonary effort is normal. No respiratory distress.     Breath sounds: Normal breath sounds. No wheezing or rales.  Musculoskeletal:     Cervical back: Normal range of motion and neck supple.  Skin:    General: Skin is warm and dry.     Capillary  Refill: Capillary refill takes less than 2 seconds.  Neurological:     General: No focal deficit present.     Mental Status: She is alert and oriented to person, place, and time. Mental status is at baseline.  Psychiatric:        Mood and Affect: Mood normal.        Behavior: Behavior normal.        Thought Content: Thought content normal.        Judgment: Judgment normal.    Results for orders placed or performed in visit on 06/03/22  Microscopic Examination   Urine  Result Value Ref Range   WBC, UA 6-10 (A) 0 - 5 /hpf   RBC, Urine 0-2 0 - 2 /hpf   Epithelial Cells (non renal) 0-10 0 - 10 /hpf   Bacteria, UA Many (A) None seen/Few  Urine Culture   Specimen: Urine   UR  Result Value Ref Range   Urine Culture, Routine Final report (A)    Organism ID, Bacteria Escherichia coli (A)    Antimicrobial Susceptibility Comment   CBC with Differential/Platelet  Result Value Ref Range   WBC 6.6 3.4 - 10.8 x10E3/uL   RBC  4.46 3.77 - 5.28 x10E6/uL   Hemoglobin 14.3 11.1 - 15.9 g/dL   Hematocrit 16.1 09.6 - 46.6 %   MCV 95 79 - 97 fL   MCH 32.1 26.6 - 33.0 pg   MCHC 33.7 31.5 - 35.7 g/dL   RDW 04.5 40.9 - 81.1 %   Platelets 201 150 - 450 x10E3/uL   Neutrophils 72 Not Estab. %   Lymphs 18 Not Estab. %   Monocytes 8 Not Estab. %   Eos 1 Not Estab. %   Basos 1 Not Estab. %   Neutrophils Absolute 4.7 1.4 - 7.0 x10E3/uL   Lymphocytes Absolute 1.2 0.7 - 3.1 x10E3/uL   Monocytes Absolute 0.5 0.1 - 0.9 x10E3/uL   EOS (ABSOLUTE) 0.1 0.0 - 0.4 x10E3/uL   Basophils Absolute 0.1 0.0 - 0.2 x10E3/uL   Immature Granulocytes 0 Not Estab. %   Immature Grans (Abs) 0.0 0.0 - 0.1 x10E3/uL  Comprehensive metabolic panel  Result Value Ref Range   Glucose 92 70 - 99 mg/dL   BUN 18 6 - 20 mg/dL   Creatinine, Ser 9.14 0.57 - 1.00 mg/dL   eGFR 782 >95 AO/ZHY/8.65   BUN/Creatinine Ratio 22 9 - 23   Sodium 139 134 - 144 mmol/L   Potassium 4.2 3.5 - 5.2 mmol/L   Chloride 106 96 - 106 mmol/L   CO2 18 (L) 20 - 29 mmol/L   Calcium 9.2 8.7 - 10.2 mg/dL   Total Protein 6.3 6.0 - 8.5 g/dL   Albumin 4.4 4.0 - 5.0 g/dL   Globulin, Total 1.9 1.5 - 4.5 g/dL   Albumin/Globulin Ratio 2.3 (H) 1.2 - 2.2   Bilirubin Total 0.3 0.0 - 1.2 mg/dL   Alkaline Phosphatase 84 42 - 106 IU/L   AST 15 0 - 40 IU/L   ALT 19 0 - 32 IU/L  Lipid panel  Result Value Ref Range   Cholesterol, Total 128 100 - 169 mg/dL   Triglycerides 73 0 - 89 mg/dL   HDL 46 >78 mg/dL   VLDL Cholesterol Cal 15 5 - 40 mg/dL   LDL Chol Calc (NIH) 67 0 - 109 mg/dL   Chol/HDL Ratio 2.8 0.0 - 4.4 ratio  TSH  Result Value Ref Range   TSH 4.430 0.450 - 4.500  uIU/mL  Urinalysis, Routine w reflex microscopic  Result Value Ref Range   Specific Gravity, UA >1.030 (H) 1.005 - 1.030   pH, UA 5.0 5.0 - 7.5   Color, UA Yellow Yellow   Appearance Ur Cloudy (A) Clear   Leukocytes,UA Trace (A) Negative   Protein,UA Negative Negative/Trace   Glucose, UA Negative Negative    Ketones, UA Negative Negative   RBC, UA 1+ (A) Negative   Bilirubin, UA Negative Negative   Urobilinogen, Ur 0.2 0.2 - 1.0 mg/dL   Nitrite, UA Positive (A) Negative   Microscopic Examination See below:   HIV Antibody (routine testing w rflx)  Result Value Ref Range   HIV Screen 4th Generation wRfx Non Reactive Non Reactive  Hepatitis C Antibody  Result Value Ref Range   Hep C Virus Ab Non Reactive Non Reactive      Assessment & Plan:   Problem List Items Addressed This Visit       Other   Anxiety and depression - Primary     Follow up plan: No follow-ups on file.

## 2022-06-29 ENCOUNTER — Ambulatory Visit: Payer: Managed Care, Other (non HMO) | Admitting: Nurse Practitioner

## 2022-06-29 DIAGNOSIS — F32A Depression, unspecified: Secondary | ICD-10-CM

## 2022-06-30 ENCOUNTER — Ambulatory Visit: Payer: Managed Care, Other (non HMO) | Admitting: Nurse Practitioner

## 2022-06-30 ENCOUNTER — Encounter: Payer: Self-pay | Admitting: Nurse Practitioner

## 2022-06-30 VITALS — BP 123/80 | HR 91 | Temp 98.6°F

## 2022-06-30 DIAGNOSIS — F32A Depression, unspecified: Secondary | ICD-10-CM | POA: Diagnosis not present

## 2022-06-30 DIAGNOSIS — F419 Anxiety disorder, unspecified: Secondary | ICD-10-CM | POA: Diagnosis not present

## 2022-06-30 NOTE — Progress Notes (Unsigned)
BP 123/80 (BP Location: Right Arm, Patient Position: Sitting)   Pulse 91   Temp 98.6 F (37 C)   LMP 05/13/2022 (Approximate)   SpO2 98%    Subjective:    Patient ID: Katherine Ibarra, female    DOB: 05-19-2000, 22 y.o.   MRN: [id number]  HPI: Katherine Ibarra is a 22 y.o. female  Chief Complaint  Patient presents with   Depression   DEPRESSION/ANXIETY Patient presents she is feeling much better.  She is seeing a counselor to clinic with complaints of depression and anxiety.   She likes the Wellbutrin and the Lexapro 20mg .   She is also taking Buspar everyday in the afternoon.  She was started on Trazodone which is helping her sleep. Denies SI.    Flowsheet Row Video Visit from 06/17/2022 in [facility]  PHQ-9 Total Score 7         06/17/2022    4:10 PM 06/03/2022    8:54 AM 04/30/2022    8:31 AM 03/30/2022   10:23 AM  GAD 7 : Generalized Anxiety Score  Nervous, Anxious, on Edge 1 1 1 3   Control/stop worrying 1 1 1 3   Worry too much - different things 0 1 1 3   Trouble relaxing 1 1 2 3   Restless 0 0 1 2  Easily annoyed or irritable 1 1 2 3   Afraid - awful might happen 1 1 2 3   Total GAD 7 Score 5 6 10 20   Anxiety Difficulty Somewhat difficult Somewhat difficult Somewhat difficult Somewhat difficult     Relevant past medical, surgical, family and social history reviewed and updated as indicated. Interim medical history since our last visit reviewed. Allergies and medications reviewed and updated.  Review of Systems  Psychiatric/Behavioral:  Positive for dysphoric mood. Negative for sleep disturbance and suicidal ideas. The patient is nervous/anxious.     Per HPI unless specifically indicated above     Objective:    BP 123/80 (BP Location: Right Arm, Patient Position: Sitting)   Pulse 91   Temp 98.6 F (37 C)   LMP 05/13/2022 (Approximate)   SpO2 98%   Wt Readings from Last 3 Encounters:  06/03/22 201 lb 8 oz (91.4 kg) (98 %, Z= 1.97)*   04/30/22 202 lb 3.2 oz (91.7 kg) (98 %, Z= 1.98)*  03/30/22 191 lb 11.2 oz (87 kg) (97 %, Z= 1.82)*   * Growth percentiles are based on CDC (Girls, 2-20 Years) data.    Physical Exam Vitals and nursing note reviewed.  Constitutional:      General: She is not in acute distress.    Appearance: Normal appearance. She is not ill-appearing, toxic-appearing or diaphoretic.  HENT:     Head: Normocephalic.     Right Ear: External ear normal.     Left Ear: External ear normal.     Nose: Nose normal.     Mouth/Throat:     Mouth: Mucous membranes are moist.     Pharynx: Oropharynx is clear.  Eyes:     General:        Right eye: No discharge.        Left eye: No discharge.     Extraocular Movements: Extraocular movements intact.     Conjunctiva/sclera: Conjunctivae normal.     Pupils: Pupils are equal, round, and reactive to light.  Cardiovascular:     Rate and Rhythm: Normal rate and regular rhythm.     Heart sounds: No murmur heard.  Pulmonary:     Effort: Pulmonary effort is normal. No respiratory distress.     Breath sounds: Normal breath sounds. No wheezing or rales.  Musculoskeletal:     Cervical back: Normal range of motion and neck supple.  Skin:    General: Skin is warm and dry.     Capillary Refill: Capillary refill takes less than 2 seconds.  Neurological:     General: No focal deficit present.     Mental Status: She is alert and oriented to person, place, and time. Mental status is at baseline.  Psychiatric:        Mood and Affect: Mood normal.        Behavior: Behavior normal.        Thought Content: Thought content normal.        Judgment: Judgment normal.     Results for orders placed or performed in visit on 06/03/22  Microscopic Examination   Urine  Result Value Ref Range   WBC, UA 6-10 (A) 0 - 5 /hpf   RBC, Urine 0-2 0 - 2 /hpf   Epithelial Cells (non renal) 0-10 0 - 10 /hpf   Bacteria, UA Many (A) None seen/Few  Urine Culture   Specimen: Urine   UR   Result Value Ref Range   Urine Culture, Routine Final report (A)    Organism ID, Bacteria Escherichia coli (A)    Antimicrobial Susceptibility Comment   CBC with Differential/Platelet  Result Value Ref Range   WBC 6.6 3.4 - 10.8 x10E3/uL   RBC 4.46 3.77 - 5.28 x10E6/uL   Hemoglobin 14.3 11.1 - 15.9 g/dL   Hematocrit 40.9 81.1 - 46.6 %   MCV 95 79 - 97 fL   MCH 32.1 26.6 - 33.0 pg   MCHC 33.7 31.5 - 35.7 g/dL   RDW 91.4 78.2 - 95.6 %   Platelets 201 150 - 450 x10E3/uL   Neutrophils 72 Not Estab. %   Lymphs 18 Not Estab. %   Monocytes 8 Not Estab. %   Eos 1 Not Estab. %   Basos 1 Not Estab. %   Neutrophils Absolute 4.7 1.4 - 7.0 x10E3/uL   Lymphocytes Absolute 1.2 0.7 - 3.1 x10E3/uL   Monocytes Absolute 0.5 0.1 - 0.9 x10E3/uL   EOS (ABSOLUTE) 0.1 0.0 - 0.4 x10E3/uL   Basophils Absolute 0.1 0.0 - 0.2 x10E3/uL   Immature Granulocytes 0 Not Estab. %   Immature Grans (Abs) 0.0 0.0 - 0.1 x10E3/uL  Comprehensive metabolic panel  Result Value Ref Range   Glucose 92 70 - 99 mg/dL   BUN 18 6 - 20 mg/dL   Creatinine, Ser 2.13 0.57 - 1.00 mg/dL   eGFR 086 >57 QI/ONG/2.95   BUN/Creatinine Ratio 22 9 - 23   Sodium 139 134 - 144 mmol/L   Potassium 4.2 3.5 - 5.2 mmol/L   Chloride 106 96 - 106 mmol/L   CO2 18 (L) 20 - 29 mmol/L   Calcium 9.2 8.7 - 10.2 mg/dL   Total Protein 6.3 6.0 - 8.5 g/dL   Albumin 4.4 4.0 - 5.0 g/dL   Globulin, Total 1.9 1.5 - 4.5 g/dL   Albumin/Globulin Ratio 2.3 (H) 1.2 - 2.2   Bilirubin Total 0.3 0.0 - 1.2 mg/dL   Alkaline Phosphatase 84 42 - 106 IU/L   AST 15 0 - 40 IU/L   ALT 19 0 - 32 IU/L  Lipid panel  Result Value Ref Range   Cholesterol, Total 128 100 - 169 mg/dL  Triglycerides 73 0 - 89 mg/dL   HDL 46 >16 mg/dL   VLDL Cholesterol Cal 15 5 - 40 mg/dL   LDL Chol Calc (NIH) 67 0 - 109 mg/dL   Chol/HDL Ratio 2.8 0.0 - 4.4 ratio  TSH  Result Value Ref Range   TSH 4.430 0.450 - 4.500 uIU/mL  Urinalysis, Routine w reflex microscopic  Result Value  Ref Range   Specific Gravity, UA >1.030 (H) 1.005 - 1.030   pH, UA 5.0 5.0 - 7.5   Color, UA Yellow Yellow   Appearance Ur Cloudy (A) Clear   Leukocytes,UA Trace (A) Negative   Protein,UA Negative Negative/Trace   Glucose, UA Negative Negative   Ketones, UA Negative Negative   RBC, UA 1+ (A) Negative   Bilirubin, UA Negative Negative   Urobilinogen, Ur 0.2 0.2 - 1.0 mg/dL   Nitrite, UA Positive (A) Negative   Microscopic Examination See below:   HIV Antibody (routine testing w rflx)  Result Value Ref Range   HIV Screen 4th Generation wRfx Non Reactive Non Reactive  Hepatitis C Antibody  Result Value Ref Range   Hep C Virus Ab Non Reactive Non Reactive      Assessment & Plan:   Problem List Items Addressed This Visit       Other   Anxiety and depression - Primary     Follow up plan: No follow-ups on file.

## 2022-07-01 NOTE — Assessment & Plan Note (Signed)
Chronic.  Controlled.  Continue with current medication regimen.  Patient has been having some drowsiness during the day.  Encouraged her to take Gabapentin at night.  If too sleepy can cut back on Trazodone.  Continue with lexapro, Buspar and Wellbutrin.  Return to clinic in 2 months for reevaluation.  Call sooner if concerns arise.

## 2022-08-31 ENCOUNTER — Ambulatory Visit: Payer: Managed Care, Other (non HMO) | Admitting: Nurse Practitioner

## 2022-08-31 ENCOUNTER — Encounter: Payer: Self-pay | Admitting: Nurse Practitioner

## 2022-08-31 VITALS — BP 106/71 | HR 90 | Temp 98.4°F | Wt 200.8 lb

## 2022-08-31 DIAGNOSIS — F32A Depression, unspecified: Secondary | ICD-10-CM

## 2022-08-31 DIAGNOSIS — F419 Anxiety disorder, unspecified: Secondary | ICD-10-CM | POA: Diagnosis not present

## 2022-08-31 MED ORDER — ESCITALOPRAM OXALATE 20 MG PO TABS: 20.0000 mg | ORAL_TABLET | Freq: Every day | ORAL | 1 refills | Status: DC

## 2022-08-31 MED ORDER — BUSPIRONE HCL 5 MG PO TABS: 5.0000 mg | ORAL_TABLET | Freq: Two times a day (BID) | ORAL | 1 refills | Status: DC | PRN

## 2022-08-31 MED ORDER — BUPROPION HCL ER (XL) 300 MG PO TB24: 300.0000 mg | ORAL_TABLET | Freq: Every day | ORAL | 1 refills | Status: DC

## 2022-08-31 NOTE — Progress Notes (Signed)
BP 106/71   Pulse 90   Temp 98.4 F (36.9 C) (Oral)   Wt 200 lb 12.8 oz (91.1 kg)   LMP 08/09/2022 (Approximate)   SpO2 98%   BMI 35.57 kg/m    Subjective:    Patient ID: Katherine Ibarra, female    DOB: [birth year], 22 y.o.   MRN: [id number]  HPI: Katherine Ibarra is a 22 y.o. female  Chief Complaint  Patient presents with   Anxiety   Depression   DEPRESSION/ANXIETY Patient presents she is feeling much better.  She is seeing a counselor to clinic with complaints of depression and anxiety monthly.   She likes the Wellbutrin and the Lexapro 20mg .   She is also taking Buspar everyday in the afternoon.  She was started on Trazodone which is helping her sleep. Denies SI.    Flowsheet Row Office Visit from 08/31/2022 in [facility]  PHQ-9 Total Score 9         08/31/2022    9:52 AM 06/30/2022    4:25 PM 06/17/2022    4:10 PM 06/03/2022    8:54 AM  GAD 7 : Generalized Anxiety Score  Nervous, Anxious, on Edge 2 1 1 1   Control/stop worrying 3 1 1 1   Worry too much - different things 3 1 0 1  Trouble relaxing 2 1 1 1   Restless 1 1 0 0  Easily annoyed or irritable 3 2 1 1   Afraid - awful might happen 2 1 1 1   Total GAD 7 Score 16 8 5 6   Anxiety Difficulty Somewhat difficult Not difficult at all Somewhat difficult Somewhat difficult     Relevant past medical, surgical, family and social history reviewed and updated as indicated. Interim medical history since our last visit reviewed. Allergies and medications reviewed and updated.  Review of Systems  Psychiatric/Behavioral:  Positive for dysphoric mood. Negative for sleep disturbance and suicidal ideas. The patient is nervous/anxious.        Having drowsiness    Per HPI unless specifically indicated above     Objective:    BP 106/71   Pulse 90   Temp 98.4 F (36.9 C) (Oral)   Wt 200 lb 12.8 oz (91.1 kg)   LMP 08/09/2022 (Approximate)   SpO2 98%   BMI 35.57 kg/m   Wt Readings from Last 3  Encounters:  08/31/22 200 lb 12.8 oz (91.1 kg) (97 %, Z= 1.95)*  06/03/22 201 lb 8 oz (91.4 kg) (98 %, Z= 1.97)*  04/30/22 202 lb 3.2 oz (91.7 kg) (98 %, Z= 1.98)*   * Growth percentiles are based on CDC (Girls, 2-20 Years) data.    Physical Exam Vitals and nursing note reviewed.  Constitutional:      General: She is not in acute distress.    Appearance: Normal appearance. She is not ill-appearing, toxic-appearing or diaphoretic.  HENT:     Head: Normocephalic.     Right Ear: External ear normal.     Left Ear: External ear normal.     Nose: Nose normal.     Mouth/Throat:     Mouth: Mucous membranes are moist.     Pharynx: Oropharynx is clear.  Eyes:     General:        Right eye: No discharge.        Left eye: No discharge.     Extraocular Movements: Extraocular movements intact.     Conjunctiva/sclera: Conjunctivae normal.     Pupils:  Pupils are equal, round, and reactive to light.  Cardiovascular:     Rate and Rhythm: Normal rate and regular rhythm.     Heart sounds: No murmur heard. Pulmonary:     Effort: Pulmonary effort is normal. No respiratory distress.     Breath sounds: Normal breath sounds. No wheezing or rales.  Musculoskeletal:     Cervical back: Normal range of motion and neck supple.  Skin:    General: Skin is warm and dry.     Capillary Refill: Capillary refill takes less than 2 seconds.  Neurological:     General: No focal deficit present.     Mental Status: She is alert and oriented to person, place, and time. Mental status is at baseline.  Psychiatric:        Mood and Affect: Mood normal.        Behavior: Behavior normal.        Thought Content: Thought content normal.        Judgment: Judgment normal.     Results for orders placed or performed in visit on 06/03/22  Microscopic Examination   Urine  Result Value Ref Range   WBC, UA 6-10 (A) 0 - 5 /hpf   RBC, Urine 0-2 0 - 2 /hpf   Epithelial Cells (non renal) 0-10 0 - 10 /hpf   Bacteria, UA  Many (A) None seen/Few  Urine Culture   Specimen: Urine   UR  Result Value Ref Range   Urine Culture, Routine Final report (A)    Organism ID, Bacteria Escherichia coli (A)    Antimicrobial Susceptibility Comment   CBC with Differential/Platelet  Result Value Ref Range   WBC 6.6 3.4 - 10.8 x10E3/uL   RBC 4.46 3.77 - 5.28 x10E6/uL   Hemoglobin 14.3 11.1 - 15.9 g/dL   Hematocrit 95.6 21.3 - 46.6 %   MCV 95 79 - 97 fL   MCH 32.1 26.6 - 33.0 pg   MCHC 33.7 31.5 - 35.7 g/dL   RDW 08.6 57.8 - 46.9 %   Platelets 201 150 - 450 x10E3/uL   Neutrophils 72 Not Estab. %   Lymphs 18 Not Estab. %   Monocytes 8 Not Estab. %   Eos 1 Not Estab. %   Basos 1 Not Estab. %   Neutrophils Absolute 4.7 1.4 - 7.0 x10E3/uL   Lymphocytes Absolute 1.2 0.7 - 3.1 x10E3/uL   Monocytes Absolute 0.5 0.1 - 0.9 x10E3/uL   EOS (ABSOLUTE) 0.1 0.0 - 0.4 x10E3/uL   Basophils Absolute 0.1 0.0 - 0.2 x10E3/uL   Immature Granulocytes 0 Not Estab. %   Immature Grans (Abs) 0.0 0.0 - 0.1 x10E3/uL  Comprehensive metabolic panel  Result Value Ref Range   Glucose 92 70 - 99 mg/dL   BUN 18 6 - 20 mg/dL   Creatinine, Ser 6.29 0.57 - 1.00 mg/dL   eGFR 528 >41 LK/GMW/1.02   BUN/Creatinine Ratio 22 9 - 23   Sodium 139 134 - 144 mmol/L   Potassium 4.2 3.5 - 5.2 mmol/L   Chloride 106 96 - 106 mmol/L   CO2 18 (L) 20 - 29 mmol/L   Calcium 9.2 8.7 - 10.2 mg/dL   Total Protein 6.3 6.0 - 8.5 g/dL   Albumin 4.4 4.0 - 5.0 g/dL   Globulin, Total 1.9 1.5 - 4.5 g/dL   Albumin/Globulin Ratio 2.3 (H) 1.2 - 2.2   Bilirubin Total 0.3 0.0 - 1.2 mg/dL   Alkaline Phosphatase 84 42 - 106 IU/L  AST 15 0 - 40 IU/L   ALT 19 0 - 32 IU/L  Lipid panel  Result Value Ref Range   Cholesterol, Total 128 100 - 169 mg/dL   Triglycerides 73 0 - 89 mg/dL   HDL 46 >16 mg/dL   VLDL Cholesterol Cal 15 5 - 40 mg/dL   LDL Chol Calc (NIH) 67 0 - 109 mg/dL   Chol/HDL Ratio 2.8 0.0 - 4.4 ratio  TSH  Result Value Ref Range   TSH 4.430 0.450 - 4.500  uIU/mL  Urinalysis, Routine w reflex microscopic  Result Value Ref Range   Specific Gravity, UA >1.030 (H) 1.005 - 1.030   pH, UA 5.0 5.0 - 7.5   Color, UA Yellow Yellow   Appearance Ur Cloudy (A) Clear   Leukocytes,UA Trace (A) Negative   Protein,UA Negative Negative/Trace   Glucose, UA Negative Negative   Ketones, UA Negative Negative   RBC, UA 1+ (A) Negative   Bilirubin, UA Negative Negative   Urobilinogen, Ur 0.2 0.2 - 1.0 mg/dL   Nitrite, UA Positive (A) Negative   Microscopic Examination See below:   HIV Antibody (routine testing w rflx)  Result Value Ref Range   HIV Screen 4th Generation wRfx Non Reactive Non Reactive  Hepatitis C Antibody  Result Value Ref Range   Hep C Virus Ab Non Reactive Non Reactive      Assessment & Plan:   Problem List Items Addressed This Visit       Other   Anxiety and depression - Primary    Chronic.  Controlled, despite PHQ9.  Continue with current medication regimen of Lexapro, Wellbutrin and Buspar.  Refills sent today.  Return to clinic in 4 months for reevaluation.  Call sooner if concerns arise.        Relevant Medications   buPROPion (WELLBUTRIN XL) 300 MG 24 hr tablet   busPIRone (BUSPAR) 5 MG tablet   escitalopram (LEXAPRO) 20 MG tablet     Follow up plan: Return in about 4 months (around 12/31/2022) for Depression/Anxiety FU.

## 2022-08-31 NOTE — Assessment & Plan Note (Addendum)
Chronic.  Controlled, despite PHQ9.  Continue with current medication regimen of Lexapro, Wellbutrin and Buspar.  Refills sent today.  Return to clinic in 4 months for reevaluation.  Call sooner if concerns arise.

## 2023-01-03 ENCOUNTER — Ambulatory Visit: Payer: Managed Care, Other (non HMO) | Admitting: Nurse Practitioner

## 2023-01-03 ENCOUNTER — Encounter: Payer: Self-pay | Admitting: Nurse Practitioner

## 2023-01-03 VITALS — BP 120/81 | HR 90 | Temp 97.8°F | Wt 199.6 lb

## 2023-01-03 DIAGNOSIS — F32A Depression, unspecified: Secondary | ICD-10-CM | POA: Diagnosis not present

## 2023-01-03 DIAGNOSIS — F419 Anxiety disorder, unspecified: Secondary | ICD-10-CM

## 2023-01-03 MED ORDER — ESCITALOPRAM OXALATE 20 MG PO TABS: 20.0000 mg | ORAL_TABLET | Freq: Every day | ORAL | 1 refills | Status: DC

## 2023-01-03 MED ORDER — BUPROPION HCL ER (XL) 300 MG PO TB24: 300.0000 mg | ORAL_TABLET | Freq: Every day | ORAL | 1 refills | Status: DC

## 2023-01-03 MED ORDER — BUSPIRONE HCL 5 MG PO TABS: 5.0000 mg | ORAL_TABLET | Freq: Two times a day (BID) | ORAL | 1 refills | Status: DC | PRN

## 2023-01-03 NOTE — Progress Notes (Signed)
BP 120/81   Pulse 90   Temp 97.8 F (36.6 C) (Oral)   Wt 199 lb 9.6 oz (90.5 kg)   LMP 01/02/2023 (Exact Date)   SpO2 99%   BMI 35.36 kg/m    Subjective:    Patient ID: Katherine Ibarra, female    DOB: [birth year], 22 y.o.   MRN: [id number]  HPI: Katherine Ibarra is a 22 y.o. female  Chief Complaint  Patient presents with   Anxiety   Depression   DEPRESSION/ANXIETY Patient presents she is feeling much better.  She feels like these are good doses of medication for her.  She is seeing a counselor to clinic with complaints of depression and anxiety monthly.   She likes the Wellbutrin and the Lexapro 20mg .   She is also taking Buspar everyday in the afternoon.  She was started on Trazodone which is helping her sleep. Denies SI.    Flowsheet Row Office Visit from 01/03/2023 in [facility]  PHQ-9 Total Score 11         01/03/2023   10:53 AM 08/31/2022    9:52 AM 06/30/2022    4:25 PM 06/17/2022    4:10 PM  GAD 7 : Generalized Anxiety Score  Nervous, Anxious, on Edge 2 2 1 1   Control/stop worrying 3 3 1 1   Worry too much - different things 3 3 1  0  Trouble relaxing 2 2 1 1   Restless 1 1 1  0  Easily annoyed or irritable 2 3 2 1   Afraid - awful might happen 3 2 1 1   Total GAD 7 Score 16 16 8 5   Anxiety Difficulty Somewhat difficult Somewhat difficult Not difficult at all Somewhat difficult   Patient is due to see the Oncologist this month.  So far everything   Relevant past medical, surgical, family and social history reviewed and updated as indicated. Interim medical history since our last visit reviewed. Allergies and medications reviewed and updated.  Review of Systems  Psychiatric/Behavioral:  Positive for dysphoric mood. Negative for sleep disturbance and suicidal ideas. The patient is nervous/anxious.        Having drowsiness    Per HPI unless specifically indicated above     Objective:    BP 120/81   Pulse 90   Temp 97.8 F (36.6 C)  (Oral)   Wt 199 lb 9.6 oz (90.5 kg)   LMP 01/02/2023 (Exact Date)   SpO2 99%   BMI 35.36 kg/m   Wt Readings from Last 3 Encounters:  01/03/23 199 lb 9.6 oz (90.5 kg)  08/31/22 200 lb 12.8 oz (91.1 kg) (97%, Z= 1.95)*  06/03/22 201 lb 8 oz (91.4 kg) (98%, Z= 1.97)*   * Growth percentiles are based on CDC (Girls, 2-20 Years) data.    Physical Exam Vitals and nursing note reviewed.  Constitutional:      General: She is not in acute distress.    Appearance: Normal appearance. She is not ill-appearing, toxic-appearing or diaphoretic.  HENT:     Head: Normocephalic.     Right Ear: External ear normal.     Left Ear: External ear normal.     Nose: Nose normal.     Mouth/Throat:     Mouth: Mucous membranes are moist.     Pharynx: Oropharynx is clear.  Eyes:     General:        Right eye: No discharge.        Left eye: No discharge.  Extraocular Movements: Extraocular movements intact.     Conjunctiva/sclera: Conjunctivae normal.     Pupils: Pupils are equal, round, and reactive to light.  Cardiovascular:     Rate and Rhythm: Normal rate and regular rhythm.     Heart sounds: No murmur heard. Pulmonary:     Effort: Pulmonary effort is normal. No respiratory distress.     Breath sounds: Normal breath sounds. No wheezing or rales.  Musculoskeletal:     Cervical back: Normal range of motion and neck supple.  Skin:    General: Skin is warm and dry.     Capillary Refill: Capillary refill takes less than 2 seconds.  Neurological:     General: No focal deficit present.     Mental Status: She is alert and oriented to person, place, and time. Mental status is at baseline.  Psychiatric:        Mood and Affect: Mood normal.        Behavior: Behavior normal.        Thought Content: Thought content normal.        Judgment: Judgment normal.     Results for orders placed or performed in visit on 06/03/22  Microscopic Examination   Urine  Result Value Ref Range   WBC, UA 6-10 (A) 0  - 5 /hpf   RBC, Urine 0-2 0 - 2 /hpf   Epithelial Cells (non renal) 0-10 0 - 10 /hpf   Bacteria, UA Many (A) None seen/Few  Urine Culture   Specimen: Urine   UR  Result Value Ref Range   Urine Culture, Routine Final report (A)    Organism ID, Bacteria Escherichia coli (A)    Antimicrobial Susceptibility Comment   CBC with Differential/Platelet  Result Value Ref Range   WBC 6.6 3.4 - 10.8 x10E3/uL   RBC 4.46 3.77 - 5.28 x10E6/uL   Hemoglobin 14.3 11.1 - 15.9 g/dL   Hematocrit 29.5 28.4 - 46.6 %   MCV 95 79 - 97 fL   MCH 32.1 26.6 - 33.0 pg   MCHC 33.7 31.5 - 35.7 g/dL   RDW 13.2 44.0 - 10.2 %   Platelets 201 150 - 450 x10E3/uL   Neutrophils 72 Not Estab. %   Lymphs 18 Not Estab. %   Monocytes 8 Not Estab. %   Eos 1 Not Estab. %   Basos 1 Not Estab. %   Neutrophils Absolute 4.7 1.4 - 7.0 x10E3/uL   Lymphocytes Absolute 1.2 0.7 - 3.1 x10E3/uL   Monocytes Absolute 0.5 0.1 - 0.9 x10E3/uL   EOS (ABSOLUTE) 0.1 0.0 - 0.4 x10E3/uL   Basophils Absolute 0.1 0.0 - 0.2 x10E3/uL   Immature Granulocytes 0 Not Estab. %   Immature Grans (Abs) 0.0 0.0 - 0.1 x10E3/uL  Comprehensive metabolic panel  Result Value Ref Range   Glucose 92 70 - 99 mg/dL   BUN 18 6 - 20 mg/dL   Creatinine, Ser 7.25 0.57 - 1.00 mg/dL   eGFR 366 >44 IH/KVQ/2.59   BUN/Creatinine Ratio 22 9 - 23   Sodium 139 134 - 144 mmol/L   Potassium 4.2 3.5 - 5.2 mmol/L   Chloride 106 96 - 106 mmol/L   CO2 18 (L) 20 - 29 mmol/L   Calcium 9.2 8.7 - 10.2 mg/dL   Total Protein 6.3 6.0 - 8.5 g/dL   Albumin 4.4 4.0 - 5.0 g/dL   Globulin, Total 1.9 1.5 - 4.5 g/dL   Albumin/Globulin Ratio 2.3 (H) 1.2 - 2.2   Bilirubin  Total 0.3 0.0 - 1.2 mg/dL   Alkaline Phosphatase 84 42 - 106 IU/L   AST 15 0 - 40 IU/L   ALT 19 0 - 32 IU/L  Lipid panel  Result Value Ref Range   Cholesterol, Total 128 100 - 169 mg/dL   Triglycerides 73 0 - 89 mg/dL   HDL 46 >81 mg/dL   VLDL Cholesterol Cal 15 5 - 40 mg/dL   LDL Chol Calc (NIH) 67 0 - 109  mg/dL   Chol/HDL Ratio 2.8 0.0 - 4.4 ratio  TSH  Result Value Ref Range   TSH 4.430 0.450 - 4.500 uIU/mL  Urinalysis, Routine w reflex microscopic  Result Value Ref Range   Specific Gravity, UA >1.030 (H) 1.005 - 1.030   pH, UA 5.0 5.0 - 7.5   Color, UA Yellow Yellow   Appearance Ur Cloudy (A) Clear   Leukocytes,UA Trace (A) Negative   Protein,UA Negative Negative/Trace   Glucose, UA Negative Negative   Ketones, UA Negative Negative   RBC, UA 1+ (A) Negative   Bilirubin, UA Negative Negative   Urobilinogen, Ur 0.2 0.2 - 1.0 mg/dL   Nitrite, UA Positive (A) Negative   Microscopic Examination See below:   HIV Antibody (routine testing w rflx)  Result Value Ref Range   HIV Screen 4th Generation wRfx Non Reactive Non Reactive  Hepatitis C Antibody  Result Value Ref Range   Hep C Virus Ab Non Reactive Non Reactive      Assessment & Plan:   Problem List Items Addressed This Visit       Other   Anxiety and depression - Primary    Chronic. Improved despite PHQ9.  Patient feels like she is doing well.  Medications are dosed appropriately. Continue with current medication regimen.  Follow up in 6 months.  Call sooner if concerns arise.       Relevant Medications   buPROPion (WELLBUTRIN XL) 300 MG 24 hr tablet   busPIRone (BUSPAR) 5 MG tablet   escitalopram (LEXAPRO) 20 MG tablet     Follow up plan: Return in about 6 months (around 07/04/2023) for Depression/Anxiety FU.

## 2023-01-03 NOTE — Assessment & Plan Note (Signed)
Chronic. Improved despite PHQ9.  Patient feels like she is doing well.  Medications are dosed appropriately. Continue with current medication regimen.  Follow up in 6 months.  Call sooner if concerns arise.

## 2023-05-10 ENCOUNTER — Encounter: Payer: Self-pay | Admitting: Nurse Practitioner

## 2023-05-11 MED ORDER — TRAZODONE HCL 50 MG PO TABS: 50.0000 mg | ORAL_TABLET | Freq: Every day | ORAL | 1 refills | Status: DC

## 2023-07-04 ENCOUNTER — Encounter: Payer: Self-pay | Admitting: Nurse Practitioner

## 2023-07-04 ENCOUNTER — Ambulatory Visit: Payer: Self-pay | Admitting: Nurse Practitioner

## 2023-07-04 VITALS — BP 117/79 | HR 84 | Temp 98.0°F | Wt 199.0 lb

## 2023-07-04 DIAGNOSIS — F419 Anxiety disorder, unspecified: Secondary | ICD-10-CM

## 2023-07-04 DIAGNOSIS — F32A Depression, unspecified: Secondary | ICD-10-CM | POA: Diagnosis not present

## 2023-07-04 MED ORDER — ESCITALOPRAM OXALATE 20 MG PO TABS
20.0000 mg | ORAL_TABLET | Freq: Every day | ORAL | 1 refills | Status: DC
Start: 2023-07-04 — End: 2023-08-18

## 2023-07-04 MED ORDER — BUSPIRONE HCL 10 MG PO TABS: 10.0000 mg | ORAL_TABLET | Freq: Two times a day (BID) | ORAL | 1 refills | Status: DC | PRN

## 2023-07-04 MED ORDER — BUPROPION HCL ER (XL) 300 MG PO TB24: 300.0000 mg | ORAL_TABLET | Freq: Every day | ORAL | 1 refills | Status: DC

## 2023-07-04 NOTE — Progress Notes (Signed)
 BP 117/79   Pulse 84   Temp 98 F (36.7 C) (Oral)   Wt 199 lb (90.3 kg)   LMP 06/07/2023 (Approximate)   SpO2 98%   BMI 35.25 kg/m    Subjective:    Patient ID: Katherine Ibarra, female    DOB: 2000-12-15, 23 y.o.   MRN: [id number]  HPI: Katherine Ibarra is a 23 y.o. female  Chief Complaint  Patient presents with   Anxiety   Depression   DEPRESSION/ANXIETY Patient presents she is doing okay.  She does forget to take her medications some and feels it when she does. She feels like her medications can be increased.  She likes the Wellbutrin  and the Lexapro  20mg .   She is also taking Buspar  everyday in the afternoon.  She was started on Trazodone  which is helping her sleep. Denies SI.    Flowsheet Row Office Visit from 07/04/2023 in [facility]  PHQ-9 Total Score 11         07/04/2023    9:10 AM 01/03/2023   10:53 AM 08/31/2022    9:52 AM 06/30/2022    4:25 PM  GAD 7 : Generalized Anxiety Score  Nervous, Anxious, on Edge 2 2 2 1   Control/stop worrying 3 3 3 1   Worry too much - different things 3 3 3 1   Trouble relaxing 3 2 2 1   Restless 3 1 1 1   Easily annoyed or irritable 3 2 3 2   Afraid - awful might happen 2 3 2 1   Total GAD 7 Score 19 16 16 8   Anxiety Difficulty Somewhat difficult Somewhat difficult Somewhat difficult Not difficult at all    Relevant past medical, surgical, family and social history reviewed and updated as indicated. Interim medical history since our last visit reviewed. Allergies and medications reviewed and updated.  Review of Systems  Psychiatric/Behavioral:  Positive for dysphoric mood. Negative for sleep disturbance and suicidal ideas. The patient is nervous/anxious.        Having drowsiness    Per HPI unless specifically indicated above     Objective:    BP 117/79   Pulse 84   Temp 98 F (36.7 C) (Oral)   Wt 199 lb (90.3 kg)   LMP 06/07/2023 (Approximate)   SpO2 98%   BMI 35.25 kg/m   Wt Readings from  Last 3 Encounters:  07/04/23 199 lb (90.3 kg)  01/03/23 199 lb 9.6 oz (90.5 kg)  08/31/22 200 lb 12.8 oz (91.1 kg) (97%, Z= 1.95)*   * Growth percentiles are based on CDC (Girls, 2-20 Years) data.    Physical Exam Vitals and nursing note reviewed.  Constitutional:      General: She is not in acute distress.    Appearance: Normal appearance. She is not ill-appearing, toxic-appearing or diaphoretic.  HENT:     Head: Normocephalic.     Right Ear: External ear normal.     Left Ear: External ear normal.     Nose: Nose normal.     Mouth/Throat:     Mouth: Mucous membranes are moist.     Pharynx: Oropharynx is clear.  Eyes:     General:        Right eye: No discharge.        Left eye: No discharge.     Extraocular Movements: Extraocular movements intact.     Conjunctiva/sclera: Conjunctivae normal.     Pupils: Pupils are equal, round, and reactive to light.  Cardiovascular:  Rate and Rhythm: Normal rate and regular rhythm.     Heart sounds: No murmur heard. Pulmonary:     Effort: Pulmonary effort is normal. No respiratory distress.     Breath sounds: Normal breath sounds. No wheezing or rales.  Musculoskeletal:     Cervical back: Normal range of motion and neck supple.  Skin:    General: Skin is warm and dry.     Capillary Refill: Capillary refill takes less than 2 seconds.  Neurological:     General: No focal deficit present.     Mental Status: She is alert and oriented to person, place, and time. Mental status is at baseline.  Psychiatric:        Mood and Affect: Mood normal.        Behavior: Behavior normal.        Thought Content: Thought content normal.        Judgment: Judgment normal.     Results for orders placed or performed in visit on 06/03/22  Microscopic Examination   Collection Time: 06/03/22  9:12 AM   Urine  Result Value Ref Range   WBC, UA 6-10 (A) 0 - 5 /hpf   RBC, Urine 0-2 0 - 2 /hpf   Epithelial Cells (non renal) 0-10 0 - 10 /hpf   Bacteria,  UA Many (A) None seen/Few  Urinalysis, Routine w reflex microscopic   Collection Time: 06/03/22  9:12 AM  Result Value Ref Range   Specific Gravity, UA >1.030 (H) 1.005 - 1.030   pH, UA 5.0 5.0 - 7.5   Color, UA Yellow Yellow   Appearance Ur Cloudy (A) Clear   Leukocytes,UA Trace (A) Negative   Protein,UA Negative Negative/Trace   Glucose, UA Negative Negative   Ketones, UA Negative Negative   RBC, UA 1+ (A) Negative   Bilirubin, UA Negative Negative   Urobilinogen, Ur 0.2 0.2 - 1.0 mg/dL   Nitrite, UA Positive (A) Negative   Microscopic Examination See below:   CBC with Differential/Platelet   Collection Time: 06/03/22  9:16 AM  Result Value Ref Range   WBC 6.6 3.4 - 10.8 x10E3/uL   RBC 4.46 3.77 - 5.28 x10E6/uL   Hemoglobin 14.3 11.1 - 15.9 g/dL   Hematocrit 96.0 45.4 - 46.6 %   MCV 95 79 - 97 fL   MCH 32.1 26.6 - 33.0 pg   MCHC 33.7 31.5 - 35.7 g/dL   RDW 09.8 11.9 - 14.7 %   Platelets 201 150 - 450 x10E3/uL   Neutrophils 72 Not Estab. %   Lymphs 18 Not Estab. %   Monocytes 8 Not Estab. %   Eos 1 Not Estab. %   Basos 1 Not Estab. %   Neutrophils Absolute 4.7 1.4 - 7.0 x10E3/uL   Lymphocytes Absolute 1.2 0.7 - 3.1 x10E3/uL   Monocytes Absolute 0.5 0.1 - 0.9 x10E3/uL   EOS (ABSOLUTE) 0.1 0.0 - 0.4 x10E3/uL   Basophils Absolute 0.1 0.0 - 0.2 x10E3/uL   Immature Granulocytes 0 Not Estab. %   Immature Grans (Abs) 0.0 0.0 - 0.1 x10E3/uL  Comprehensive metabolic panel   Collection Time: 06/03/22  9:16 AM  Result Value Ref Range   Glucose 92 70 - 99 mg/dL   BUN 18 6 - 20 mg/dL   Creatinine, Ser 8.29 0.57 - 1.00 mg/dL   eGFR 562 >13 YQ/MVH/8.46   BUN/Creatinine Ratio 22 9 - 23   Sodium 139 134 - 144 mmol/L   Potassium 4.2 3.5 - 5.2 mmol/L  Chloride 106 96 - 106 mmol/L   CO2 18 (L) 20 - 29 mmol/L   Calcium 9.2 8.7 - 10.2 mg/dL   Total Protein 6.3 6.0 - 8.5 g/dL   Albumin 4.4 4.0 - 5.0 g/dL   Globulin, Total 1.9 1.5 - 4.5 g/dL   Albumin/Globulin Ratio 2.3 (H) 1.2 -  2.2   Bilirubin Total 0.3 0.0 - 1.2 mg/dL   Alkaline Phosphatase 84 42 - 106 IU/L   AST 15 0 - 40 IU/L   ALT 19 0 - 32 IU/L  Lipid panel   Collection Time: 06/03/22  9:16 AM  Result Value Ref Range   Cholesterol, Total 128 100 - 169 mg/dL   Triglycerides 73 0 - 89 mg/dL   HDL 46 >16 mg/dL   VLDL Cholesterol Cal 15 5 - 40 mg/dL   LDL Chol Calc (NIH) 67 0 - 109 mg/dL   Chol/HDL Ratio 2.8 0.0 - 4.4 ratio  TSH   Collection Time: 06/03/22  9:16 AM  Result Value Ref Range   TSH 4.430 0.450 - 4.500 uIU/mL  HIV Antibody (routine testing w rflx)   Collection Time: 06/03/22  9:16 AM  Result Value Ref Range   HIV Screen 4th Generation wRfx Non Reactive Non Reactive  Hepatitis C Antibody   Collection Time: 06/03/22  9:16 AM  Result Value Ref Range   Hep C Virus Ab Non Reactive Non Reactive  Urine Culture   Collection Time: 06/03/22  2:51 PM   Specimen: Urine   UR  Result Value Ref Range   Urine Culture, Routine Final report (A)    Organism ID, Bacteria Escherichia coli (A)    Antimicrobial Susceptibility Comment       Assessment & Plan:   Problem List Items Addressed This Visit       Other   Anxiety and depression - Primary   Chronic.  Patient has been more stressed recently.  She will be starting school in the next month.  Will increase Buspar  to 10mg  BID.  Encouraged patient to set an alarm to make sure to take medications.  Follow up in 6 weeks.  Call sooner if concerns arise.        Relevant Medications   busPIRone  (BUSPAR ) 10 MG tablet   buPROPion  (WELLBUTRIN  XL) 300 MG 24 hr tablet   escitalopram  (LEXAPRO ) 20 MG tablet      Follow up plan: Return in about 6 weeks (around 08/15/2023) for Depression/Anxiety FU.

## 2023-07-04 NOTE — Assessment & Plan Note (Signed)
 Chronic.  Patient has been more stressed recently.  She will be starting school in the next month.  Will increase Buspar  to 10mg  BID.  Encouraged patient to set an alarm to make sure to take medications.  Follow up in 6 weeks.  Call sooner if concerns arise.

## 2023-08-18 ENCOUNTER — Ambulatory Visit: Admitting: Nurse Practitioner

## 2023-08-18 ENCOUNTER — Encounter: Payer: Self-pay | Admitting: Nurse Practitioner

## 2023-08-18 VITALS — BP 120/76 | HR 96 | Temp 98.6°F | Resp 16 | Ht 62.99 in | Wt 194.6 lb

## 2023-08-18 DIAGNOSIS — F32A Depression, unspecified: Secondary | ICD-10-CM

## 2023-08-18 DIAGNOSIS — F419 Anxiety disorder, unspecified: Secondary | ICD-10-CM | POA: Diagnosis not present

## 2023-08-18 MED ORDER — CITALOPRAM HYDROBROMIDE 20 MG PO TABS: 20.0000 mg | ORAL_TABLET | Freq: Every day | ORAL | 0 refills | Status: DC

## 2023-08-18 NOTE — Assessment & Plan Note (Signed)
 Chronic. Not well controlled.  Will change from Lexapro  to Citalopram 20mg .  If doing well at next visit, can increase dose to 40mg .  Continue with Wellbutrin , Buspar  and Trazodone .  Follow up in 1 month.  Call sooner if concerns arise.

## 2023-08-18 NOTE — Progress Notes (Signed)
 BP 120/76 (BP Location: Right Arm, Patient Position: Sitting)   Pulse 96   Temp 98.6 F (37 C) (Oral)   Resp 16   Ht 5' 2.99 (1.6 m)   Wt 194 lb 9.6 oz (88.3 kg)   LMP 08/01/2023 (Approximate)   SpO2 98%   BMI 34.48 kg/m    Subjective:    Patient ID: Katherine Ibarra, female    DOB: 17-Mar-2000, 22 y.o.   MRN: [id number]  HPI: Katherine Ibarra is a 23 y.o. female  Chief Complaint  Patient presents with   Depression/Anxiety    Still having some days. Same as last visit.    DEPRESSION/ANXIETY Patient presents she is doing okay symptoms really didn't change from the last visit.  She has started school do her stress is increasing.  She is learning to cope with the medication.  She does forget to take her medications some and feels it when she does.  She is also taking Buspar  everyday in the afternoon.  She was started on Trazodone  which is helping her sleep. Denies SI.    Flowsheet Row Office Visit from 08/18/2023 in [facility]  PHQ-9 Total Score 13      08/18/2023   10:17 AM 07/04/2023    9:10 AM 01/03/2023   10:53 AM 08/31/2022    9:52 AM  GAD 7 : Generalized Anxiety Score  Nervous, Anxious, on Edge 2 2 2 2   Control/stop worrying 3 3 3 3   Worry too much - different things 3 3 3 3   Trouble relaxing 2 3 2 2   Restless 2 3 1 1   Easily annoyed or irritable 3 3 2 3   Afraid - awful might happen 2 2 3 2   Total GAD 7 Score 17 19 16 16   Anxiety Difficulty Somewhat difficult Somewhat difficult Somewhat difficult Somewhat difficult    Relevant past medical, surgical, family and social history reviewed and updated as indicated. Interim medical history since our last visit reviewed. Allergies and medications reviewed and updated.  Review of Systems  Psychiatric/Behavioral:  Positive for dysphoric mood. Negative for sleep disturbance and suicidal ideas. The patient is nervous/anxious.        Having drowsiness    Per HPI unless specifically indicated  above     Objective:    BP 120/76 (BP Location: Right Arm, Patient Position: Sitting)   Pulse 96   Temp 98.6 F (37 C) (Oral)   Resp 16   Ht 5' 2.99 (1.6 m)   Wt 194 lb 9.6 oz (88.3 kg)   LMP 08/01/2023 (Approximate)   SpO2 98%   BMI 34.48 kg/m   Wt Readings from Last 3 Encounters:  08/18/23 194 lb 9.6 oz (88.3 kg)  07/04/23 199 lb (90.3 kg)  01/03/23 199 lb 9.6 oz (90.5 kg)    Physical Exam Vitals and nursing note reviewed.  Constitutional:      General: She is not in acute distress.    Appearance: Normal appearance. She is not ill-appearing, toxic-appearing or diaphoretic.  HENT:     Head: Normocephalic.     Right Ear: External ear normal.     Left Ear: External ear normal.     Nose: Nose normal.     Mouth/Throat:     Mouth: Mucous membranes are moist.     Pharynx: Oropharynx is clear.   Eyes:     General:        Right eye: No discharge.  Left eye: No discharge.     Extraocular Movements: Extraocular movements intact.     Conjunctiva/sclera: Conjunctivae normal.     Pupils: Pupils are equal, round, and reactive to light.    Cardiovascular:     Rate and Rhythm: Normal rate and regular rhythm.     Heart sounds: No murmur heard. Pulmonary:     Effort: Pulmonary effort is normal. No respiratory distress.     Breath sounds: Normal breath sounds. No wheezing or rales.   Musculoskeletal:     Cervical back: Normal range of motion and neck supple.   Skin:    General: Skin is warm and dry.     Capillary Refill: Capillary refill takes less than 2 seconds.   Neurological:     General: No focal deficit present.     Mental Status: She is alert and oriented to person, place, and time. Mental status is at baseline.   Psychiatric:        Mood and Affect: Mood normal.        Behavior: Behavior normal.        Thought Content: Thought content normal.        Judgment: Judgment normal.     Results for orders placed or performed in visit on 06/03/22   Microscopic Examination   Collection Time: 06/03/22  9:12 AM   Urine  Result Value Ref Range   WBC, UA 6-10 (A) 0 - 5 /hpf   RBC, Urine 0-2 0 - 2 /hpf   Epithelial Cells (non renal) 0-10 0 - 10 /hpf   Bacteria, UA Many (A) None seen/Few  Urinalysis, Routine w reflex microscopic   Collection Time: 06/03/22  9:12 AM  Result Value Ref Range   Specific Gravity, UA >1.030 (H) 1.005 - 1.030   pH, UA 5.0 5.0 - 7.5   Color, UA Yellow Yellow   Appearance Ur Cloudy (A) Clear   Leukocytes,UA Trace (A) Negative   Protein,UA Negative Negative/Trace   Glucose, UA Negative Negative   Ketones, UA Negative Negative   RBC, UA 1+ (A) Negative   Bilirubin, UA Negative Negative   Urobilinogen, Ur 0.2 0.2 - 1.0 mg/dL   Nitrite, UA Positive (A) Negative   Microscopic Examination See below:   CBC with Differential/Platelet   Collection Time: 06/03/22  9:16 AM  Result Value Ref Range   WBC 6.6 3.4 - 10.8 x10E3/uL   RBC 4.46 3.77 - 5.28 x10E6/uL   Hemoglobin 14.3 11.1 - 15.9 g/dL   Hematocrit 16.1 09.6 - 46.6 %   MCV 95 79 - 97 fL   MCH 32.1 26.6 - 33.0 pg   MCHC 33.7 31.5 - 35.7 g/dL   RDW 04.5 40.9 - 81.1 %   Platelets 201 150 - 450 x10E3/uL   Neutrophils 72 Not Estab. %   Lymphs 18 Not Estab. %   Monocytes 8 Not Estab. %   Eos 1 Not Estab. %   Basos 1 Not Estab. %   Neutrophils Absolute 4.7 1.4 - 7.0 x10E3/uL   Lymphocytes Absolute 1.2 0.7 - 3.1 x10E3/uL   Monocytes Absolute 0.5 0.1 - 0.9 x10E3/uL   EOS (ABSOLUTE) 0.1 0.0 - 0.4 x10E3/uL   Basophils Absolute 0.1 0.0 - 0.2 x10E3/uL   Immature Granulocytes 0 Not Estab. %   Immature Grans (Abs) 0.0 0.0 - 0.1 x10E3/uL  Comprehensive metabolic panel   Collection Time: 06/03/22  9:16 AM  Result Value Ref Range   Glucose 92 70 - 99 mg/dL  BUN 18 6 - 20 mg/dL   Creatinine, Ser 1.61 0.57 - 1.00 mg/dL   eGFR 096 >04 VW/UJW/1.19   BUN/Creatinine Ratio 22 9 - 23   Sodium 139 134 - 144 mmol/L   Potassium 4.2 3.5 - 5.2 mmol/L   Chloride 106 96  - 106 mmol/L   CO2 18 (L) 20 - 29 mmol/L   Calcium 9.2 8.7 - 10.2 mg/dL   Total Protein 6.3 6.0 - 8.5 g/dL   Albumin 4.4 4.0 - 5.0 g/dL   Globulin, Total 1.9 1.5 - 4.5 g/dL   Albumin/Globulin Ratio 2.3 (H) 1.2 - 2.2   Bilirubin Total 0.3 0.0 - 1.2 mg/dL   Alkaline Phosphatase 84 42 - 106 IU/L   AST 15 0 - 40 IU/L   ALT 19 0 - 32 IU/L  Lipid panel   Collection Time: 06/03/22  9:16 AM  Result Value Ref Range   Cholesterol, Total 128 100 - 169 mg/dL   Triglycerides 73 0 - 89 mg/dL   HDL 46 >14 mg/dL   VLDL Cholesterol Cal 15 5 - 40 mg/dL   LDL Chol Calc (NIH) 67 0 - 109 mg/dL   Chol/HDL Ratio 2.8 0.0 - 4.4 ratio  TSH   Collection Time: 06/03/22  9:16 AM  Result Value Ref Range   TSH 4.430 0.450 - 4.500 uIU/mL  HIV Antibody (routine testing w rflx)   Collection Time: 06/03/22  9:16 AM  Result Value Ref Range   HIV Screen 4th Generation wRfx Non Reactive Non Reactive  Hepatitis C Antibody   Collection Time: 06/03/22  9:16 AM  Result Value Ref Range   Hep C Virus Ab Non Reactive Non Reactive  Urine Culture   Collection Time: 06/03/22  2:51 PM   Specimen: Urine   UR  Result Value Ref Range   Urine Culture, Routine Final report (A)    Organism ID, Bacteria Escherichia coli (A)    Antimicrobial Susceptibility Comment       Assessment & Plan:   Problem List Items Addressed This Visit       Other   Anxiety and depression - Primary   Chronic. Not well controlled.  Will change from Lexapro  to Citalopram 20mg .  If doing well at next visit, can increase dose to 40mg .  Continue with Wellbutrin , Buspar  and Trazodone .  Follow up in 1 month.  Call sooner if concerns arise.      Relevant Medications   citalopram (CELEXA) 20 MG tablet       Follow up plan: Return in about 1 month (around 09/17/2023) for Depression/Anxiety FU.

## 2023-08-28 ENCOUNTER — Encounter: Payer: Self-pay | Admitting: Emergency Medicine

## 2023-08-28 ENCOUNTER — Ambulatory Visit
Admission: EM | Admit: 2023-08-28 | Discharge: 2023-08-28 | Disposition: A | Discharge status: Home / Self Care | Attending: Emergency Medicine | Admitting: Emergency Medicine

## 2023-08-28 DIAGNOSIS — H5789 Other specified disorders of eye and adnexa: Secondary | ICD-10-CM

## 2023-08-28 MED ORDER — METHYLPREDNISOLONE ACETATE 80 MG/ML IJ SUSP
60.0000 mg | Freq: Once | INTRAMUSCULAR | Status: AC
  Administered 2023-08-28: 60 mg via INTRAMUSCULAR

## 2023-08-28 MED ORDER — PREDNISONE 10 MG (21) PO TBPK: ORAL_TABLET | Freq: Every day | ORAL | 0 refills | Status: DC

## 2023-08-28 NOTE — Discharge Instructions (Signed)
 Today you are evaluated for the swelling to your eyes which is most likely an allergic reaction to an unknown cause, if symptoms recur we will need to move forward to attempt to figure out the cause  You have been given an injection of steroids today in the office today to help reduce the inflammatory process that occurs with this rash which will help minimize your itching as well as begin to clear  Starting tomorrow take prednisone every morning with food as directed, to continue the above process  You may continue Claritin, Zyrtec or Benadryl as needed for itching  May cold cool to warm compresses over the affected area as needed for comfort  If you start to notice any drainage that looks like pus please follow-up for reevaluation

## 2023-08-28 NOTE — ED Triage Notes (Signed)
 Pt presents with bilateral eye swelling that started today.

## 2023-08-28 NOTE — ED Provider Notes (Signed)
 [name]    CSN: [id number] Arrival date & time: 08/28/23  1258      History   Chief Complaint Chief Complaint  Patient presents with   Facial Swelling    HPI Katherine Ibarra is a 23 y.o. female.   Patient presents for evaluation of bilateral eye swelling, left side worse than right beginning overnight.  Experiencing a tightness and irritation to the eye.  Endorses she went swimming yesterday but this is not a change in activity.  Denies changes in diet recent travel.  Denies drainage, visual changes, light sensitivity, injury or trauma.  Attempted use of Benadryl.  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   Mass of left lower leg    Soft tissue mass 06/18/2016   Disequilibrium 09/11/2014   Dysarthria 09/11/2014   Migraine without aura and without status migrainosus, not intractable 09/10/2014   Episodic tension type headache 09/10/2014   Problems with learning 09/10/2014    Past Surgical History:  Procedure Laterality Date   CAUTERIZE INNER NOSE     X4   EXCISION MASS LOWER EXTREMETIES Left 06/24/2016   Procedure: EXCISION MASS LOWER EXTREMETIES WITH BIOPSIES;  Surgeon: Katherine Pastel, MD;  Location: [facility] ORS;  Service: General;  Laterality: Left;   TONSILLECTOMY     AGE 41   TYMPANOSTOMY TUBE PLACEMENT      OB History   No obstetric history on file.      Home Medications    Prior to Admission medications   Medication Sig Start Date End Date Taking? Authorizing Provider  buPROPion  (WELLBUTRIN  XL) 300 MG 24 hr tablet Take 1 tablet (300 mg total) by mouth daily.  07/04/23  Yes Katherine Pao, NP  busPIRone  (BUSPAR ) 10 MG tablet Take 1 tablet (10 mg total) by mouth 2 (two) times daily as needed. 07/04/23  Yes Katherine Pao, NP  citalopram  (CELEXA ) 20 MG tablet Take 1 tablet (20 mg total) by mouth daily. 08/18/23  Yes Katherine Pao, NP  gabapentin (NEURONTIN) 300 MG capsule Take 300 mg by mouth 3 (three) times daily.   Yes [provider]  predniSONE (STERAPRED UNI-PAK 21 TAB) 10 MG (21) TBPK tablet Take by mouth daily. Take 6 tabs by mouth daily  for 1 days, then 5 tabs for 1 days, then 4 tabs for 1 days, then 3 tabs for 1 days, 2 tabs for 1 days, then 1 tab by mouth daily for 1 days 08/28/23  Yes Katherine Morriss R, NP  traZODone  (DESYREL ) 50 MG tablet Take 1 tablet (50 mg total) by mouth at bedtime. 05/11/23  Yes Katherine Pao, NP  ibuprofen (ADVIL,MOTRIN) 200 MG tablet Take 200 mg by mouth every 6 (six) hours as needed for headache or moderate pain.    [provider]    Family History Family History  Problem Relation Age of Onset   Migraines Mother    Migraines Father     Social History Social History   Tobacco Use   Smoking status: Never    Passive exposure:  Yes   Smokeless tobacco: Never   Tobacco comments:    Mom and dad smoke outside  Vaping Use   Vaping status: Never Used  Substance Use Topics   Alcohol use: No    Alcohol/week: 0.0 standard drinks of alcohol   Drug use: No     Allergies   Patient has no known allergies.   Review of Systems Review of Systems   Physical Exam Triage Vital Signs ED Triage Vitals  Encounter Vitals Group     BP 08/28/23 1350 116/87     Girls Systolic BP Percentile --      Girls Diastolic BP Percentile --      Boys Systolic BP Percentile --      Boys Diastolic BP Percentile --      Pulse Rate 08/28/23 1350 94     Resp 08/28/23 1350 16     Temp 08/28/23 1350 98.7 F (37.1 C)     Temp Source 08/28/23 1350 Oral     SpO2 08/28/23 1350 97 %     Weight --       Height --      Head Circumference --      Peak Flow --      Pain Score 08/28/23 1337 0     Pain Loc --      Pain Education --      Exclude from Growth Chart --    No data found.  Updated Vital Signs BP 116/87 (BP Location: Left Arm)   Pulse 94   Temp 98.7 F (37.1 C) (Oral)   Resp 16   LMP 08/01/2023 (Approximate)   SpO2 97%   Visual Acuity Right Eye Distance:   Left Eye Distance:   Bilateral Distance:    Right Eye Near:   Left Eye Near:    Bilateral Near:     Physical Exam Constitutional:      Appearance: Normal appearance.   Eyes:     Extraocular Movements: Extraocular movements intact.     Comments: Mild erythema and periorbital swelling of the right eye, moderate erythema and periorbital swelling of the left eye, no drainage noted, vision grossly intact, extraocular movements intact  Pulmonary:     Effort: Pulmonary effort is normal.   Neurological:     Mental Status: She is alert and oriented to person, place, and time.      UC Treatments / Results  Labs (all labs ordered are listed, but only abnormal results are displayed) Labs Reviewed - No data to display  EKG   Radiology No results found.  Procedures Procedures (including critical care time)  Medications Ordered in UC Medications  methylPREDNISolone acetate (DEPO-MEDROL) injection 60 mg (has no administration in time range)    Initial Impression / Assessment and Plan / UC Course  I have reviewed the triage vital signs and the nursing notes.  Pertinent labs & imaging results that were available during my care of the patient were reviewed by me and considered in my medical decision making (see chart for details).  Bilateral swelling  Most likely allergic reaction, no vision changes, stable for outpatient management, unknown etiology, first occurrence, methylprednisolone IM given and prescribed oral prednisone for home use, recommended supportive care through antihistamines cold warm  compresses and over-the-counter analgesics advised follow-up if symptoms persist or if she begins to experience purulent drainage Final Clinical Impressions(s) / UC Diagnoses   Final diagnoses:  Eye swelling, bilateral     Discharge Instructions  Today you are evaluated for the swelling to your eyes which is most likely an allergic reaction to an unknown cause, if symptoms recur we will need to move forward to attempt to figure out the cause  You have been given an injection of steroids today in the office today to help reduce the inflammatory process that occurs with this rash which will help minimize your itching as well as begin to clear  Starting tomorrow take prednisone every morning with food as directed, to continue the above process  You may continue Claritin, Zyrtec or Benadryl as needed for itching  May cold cool to warm compresses over the affected area as needed for comfort  If you start to notice any drainage that looks like pus please follow-up for reevaluation    ED Prescriptions     Medication Sig Dispense Auth. Provider   predniSONE (STERAPRED UNI-PAK 21 TAB) 10 MG (21) TBPK tablet Take by mouth daily. Take 6 tabs by mouth daily  for 1 days, then 5 tabs for 1 days, then 4 tabs for 1 days, then 3 tabs for 1 days, 2 tabs for 1 days, then 1 tab by mouth daily for 1 days 21 tablet Chinyere Galiano, Shelba SAUNDERS, NP      PDMP not reviewed this encounter.   Teresa Shelba SAUNDERS, NP 08/28/23 1425

## 2023-09-20 ENCOUNTER — Encounter: Payer: Self-pay | Admitting: Nurse Practitioner

## 2023-09-20 ENCOUNTER — Ambulatory Visit: Admitting: Nurse Practitioner

## 2023-09-20 VITALS — BP 120/83 | HR 98 | Temp 98.6°F | Ht 63.0 in | Wt 199.0 lb

## 2023-09-20 DIAGNOSIS — F419 Anxiety disorder, unspecified: Secondary | ICD-10-CM | POA: Diagnosis not present

## 2023-09-20 DIAGNOSIS — F32A Depression, unspecified: Secondary | ICD-10-CM

## 2023-09-20 MED ORDER — CITALOPRAM HYDROBROMIDE 40 MG PO TABS: 40.0000 mg | ORAL_TABLET | Freq: Every day | ORAL | 0 refills | Status: DC

## 2023-09-20 NOTE — Assessment & Plan Note (Signed)
 Chronic. Not well controlled.  Will increase Citalopram  to 40mg .  Continue with Wellbutrin , Buspar  and Trazodone .  Follow up in 1 month.  Call sooner if concerns arise.

## 2023-09-20 NOTE — Progress Notes (Signed)
 BP 120/83   Pulse 98   Temp 98.6 F (37 C) (Oral)   Ht 5' 3 (1.6 m)   Wt 199 lb (90.3 kg)   LMP 08/23/2023 (Approximate)   SpO2 98%   BMI 35.25 kg/m    Subjective:    Patient ID: Katherine Ibarra, female    DOB: 07-29-2000, 22 y.o.   MRN: [id number]  HPI: Katherine Ibarra is a 23 y.o. female  Chief Complaint  Patient presents with   Depression   DEPRESSION/ANXIETY Patient presents she hasn't noticed any difference with changing to the citalopram .  She would like to increase the dose.  She is still having mood swings.  She has started school do her stress is increasing.  She is learning to cope with the medication.  She does forget to take her medications some and feels it when she does.  She is also taking Buspar  everyday in the afternoon.  She was started on Trazodone  which is helping her sleep. Denies SI.    Flowsheet Row Office Visit from 09/20/2023 in [facility]  PHQ-9 Total Score 13      09/20/2023    9:28 AM 08/18/2023   10:17 AM 07/04/2023    9:10 AM 01/03/2023   10:53 AM  GAD 7 : Generalized Anxiety Score  Nervous, Anxious, on Edge 3 2 2 2   Control/stop worrying 3 3 3 3   Worry too much - different things 3 3 3 3   Trouble relaxing 2 2 3 2   Restless 2 2 3 1   Easily annoyed or irritable 3 3 3 2   Afraid - awful might happen 2 2 2 3   Total GAD 7 Score 18 17 19 16   Anxiety Difficulty Not difficult at all Somewhat difficult Somewhat difficult Somewhat difficult    Relevant past medical, surgical, family and social history reviewed and updated as indicated. Interim medical history since our last visit reviewed. Allergies and medications reviewed and updated.  Review of Systems  Psychiatric/Behavioral:  Positive for dysphoric mood. Negative for sleep disturbance and suicidal ideas. The patient is nervous/anxious.     Per HPI unless specifically indicated above     Objective:    BP 120/83   Pulse 98   Temp 98.6 F (37 C) (Oral)   Ht 5'  3 (1.6 m)   Wt 199 lb (90.3 kg)   LMP 08/23/2023 (Approximate)   SpO2 98%   BMI 35.25 kg/m   Wt Readings from Last 3 Encounters:  09/20/23 199 lb (90.3 kg)  08/18/23 194 lb 9.6 oz (88.3 kg)  07/04/23 199 lb (90.3 kg)    Physical Exam Vitals and nursing note reviewed.  Constitutional:      General: She is not in acute distress.    Appearance: Normal appearance. She is not ill-appearing, toxic-appearing or diaphoretic.  HENT:     Head: Normocephalic.     Right Ear: External ear normal.     Left Ear: External ear normal.     Nose: Nose normal.     Mouth/Throat:     Mouth: Mucous membranes are moist.     Pharynx: Oropharynx is clear.  Eyes:     General:        Right eye: No discharge.        Left eye: No discharge.     Extraocular Movements: Extraocular movements intact.     Conjunctiva/sclera: Conjunctivae normal.     Pupils: Pupils are equal, round, and reactive to light.  Cardiovascular:     Rate and Rhythm: Normal rate and regular rhythm.     Heart sounds: No murmur heard. Pulmonary:     Effort: Pulmonary effort is normal. No respiratory distress.     Breath sounds: Normal breath sounds. No wheezing or rales.  Musculoskeletal:     Cervical back: Normal range of motion and neck supple.  Skin:    General: Skin is warm and dry.     Capillary Refill: Capillary refill takes less than 2 seconds.  Neurological:     General: No focal deficit present.     Mental Status: She is alert and oriented to person, place, and time. Mental status is at baseline.  Psychiatric:        Mood and Affect: Mood normal.        Behavior: Behavior normal.        Thought Content: Thought content normal.        Judgment: Judgment normal.     Results for orders placed or performed in visit on 06/03/22  Microscopic Examination   Collection Time: 06/03/22  9:12 AM   Urine  Result Value Ref Range   WBC, UA 6-10 (A) 0 - 5 /hpf   RBC, Urine 0-2 0 - 2 /hpf   Epithelial Cells (non renal) 0-10 0  - 10 /hpf   Bacteria, UA Many (A) None seen/Few  Urinalysis, Routine w reflex microscopic   Collection Time: 06/03/22  9:12 AM  Result Value Ref Range   Specific Gravity, UA >1.030 (H) 1.005 - 1.030   pH, UA 5.0 5.0 - 7.5   Color, UA Yellow Yellow   Appearance Ur Cloudy (A) Clear   Leukocytes,UA Trace (A) Negative   Protein,UA Negative Negative/Trace   Glucose, UA Negative Negative   Ketones, UA Negative Negative   RBC, UA 1+ (A) Negative   Bilirubin, UA Negative Negative   Urobilinogen, Ur 0.2 0.2 - 1.0 mg/dL   Nitrite, UA Positive (A) Negative   Microscopic Examination See below:   CBC with Differential/Platelet   Collection Time: 06/03/22  9:16 AM  Result Value Ref Range   WBC 6.6 3.4 - 10.8 x10E3/uL   RBC 4.46 3.77 - 5.28 x10E6/uL   Hemoglobin 14.3 11.1 - 15.9 g/dL   Hematocrit 57.5 65.9 - 46.6 %   MCV 95 79 - 97 fL   MCH 32.1 26.6 - 33.0 pg   MCHC 33.7 31.5 - 35.7 g/dL   RDW 88.2 88.2 - 84.5 %   Platelets 201 150 - 450 x10E3/uL   Neutrophils 72 Not Estab. %   Lymphs 18 Not Estab. %   Monocytes 8 Not Estab. %   Eos 1 Not Estab. %   Basos 1 Not Estab. %   Neutrophils Absolute 4.7 1.4 - 7.0 x10E3/uL   Lymphocytes Absolute 1.2 0.7 - 3.1 x10E3/uL   Monocytes Absolute 0.5 0.1 - 0.9 x10E3/uL   EOS (ABSOLUTE) 0.1 0.0 - 0.4 x10E3/uL   Basophils Absolute 0.1 0.0 - 0.2 x10E3/uL   Immature Granulocytes 0 Not Estab. %   Immature Grans (Abs) 0.0 0.0 - 0.1 x10E3/uL  Comprehensive metabolic panel   Collection Time: 06/03/22  9:16 AM  Result Value Ref Range   Glucose 92 70 - 99 mg/dL   BUN 18 6 - 20 mg/dL   Creatinine, Ser 9.18 0.57 - 1.00 mg/dL   eGFR 892 >40 fO/fpw/8.26   BUN/Creatinine Ratio 22 9 - 23   Sodium 139 134 - 144 mmol/L   Potassium  4.2 3.5 - 5.2 mmol/L   Chloride 106 96 - 106 mmol/L   CO2 18 (L) 20 - 29 mmol/L   Calcium 9.2 8.7 - 10.2 mg/dL   Total Protein 6.3 6.0 - 8.5 g/dL   Albumin 4.4 4.0 - 5.0 g/dL   Globulin, Total 1.9 1.5 - 4.5 g/dL    Albumin/Globulin Ratio 2.3 (H) 1.2 - 2.2   Bilirubin Total 0.3 0.0 - 1.2 mg/dL   Alkaline Phosphatase 84 42 - 106 IU/L   AST 15 0 - 40 IU/L   ALT 19 0 - 32 IU/L  Lipid panel   Collection Time: 06/03/22  9:16 AM  Result Value Ref Range   Cholesterol, Total 128 100 - 169 mg/dL   Triglycerides 73 0 - 89 mg/dL   HDL 46 >60 mg/dL   VLDL Cholesterol Cal 15 5 - 40 mg/dL   LDL Chol Calc (NIH) 67 0 - 109 mg/dL   Chol/HDL Ratio 2.8 0.0 - 4.4 ratio  TSH   Collection Time: 06/03/22  9:16 AM  Result Value Ref Range   TSH 4.430 0.450 - 4.500 uIU/mL  HIV Antibody (routine testing w rflx)   Collection Time: 06/03/22  9:16 AM  Result Value Ref Range   HIV Screen 4th Generation wRfx Non Reactive Non Reactive  Hepatitis C Antibody   Collection Time: 06/03/22  9:16 AM  Result Value Ref Range   Hep C Virus Ab Non Reactive Non Reactive  Urine Culture   Collection Time: 06/03/22  2:51 PM   Specimen: Urine   UR  Result Value Ref Range   Urine Culture, Routine Final report (A)    Organism ID, Bacteria Escherichia coli (A)    Antimicrobial Susceptibility Comment       Assessment & Plan:   Problem List Items Addressed This Visit       Other   Anxiety and depression - Primary   Chronic. Not well controlled.  Will increase Citalopram  to 40mg .  Continue with Wellbutrin , Buspar  and Trazodone .  Follow up in 1 month.  Call sooner if concerns arise.      Relevant Medications   citalopram  (CELEXA ) 40 MG tablet        Follow up plan: No follow-ups on file.

## 2023-10-25 ENCOUNTER — Encounter: Payer: Self-pay | Admitting: Nurse Practitioner

## 2023-10-25 ENCOUNTER — Ambulatory Visit: Admitting: Nurse Practitioner

## 2023-10-25 VITALS — BP 125/86 | HR 82 | Temp 97.3°F | Wt 200.6 lb

## 2023-10-25 DIAGNOSIS — Z0000 Encounter for general adult medical examination without abnormal findings: Secondary | ICD-10-CM | POA: Diagnosis not present

## 2023-10-25 DIAGNOSIS — Z136 Encounter for screening for cardiovascular disorders: Secondary | ICD-10-CM

## 2023-10-25 DIAGNOSIS — F419 Anxiety disorder, unspecified: Secondary | ICD-10-CM

## 2023-10-25 DIAGNOSIS — F32A Depression, unspecified: Secondary | ICD-10-CM | POA: Diagnosis not present

## 2023-10-25 DIAGNOSIS — Z021 Encounter for pre-employment examination: Secondary | ICD-10-CM

## 2023-10-25 NOTE — Progress Notes (Signed)
 BP 125/86   Pulse 82   Temp (!) 97.3 F (36.3 C) (Oral)   Wt 200 lb 9.6 oz (91 kg)   LMP 10/21/2023 (Approximate)   SpO2 98%   BMI 35.53 kg/m    Subjective:    Patient ID: Katherine Ibarra, female    DOB: [birth year], 22 y.o.   MRN: [id number]  HPI: Katherine Ibarra is a 22 y.o. female presenting on 10/25/2023 for comprehensive medical examination. Current medical complaints include:needs form for work  She currently lives with: Menopausal Symptoms: no  DEPRESSION/ANXIETY Patient states she is doing much better with her mood.  States the Wellbutrin  and Citalopram  are working well for her.  She feels like the Buspar  is a good dose for her.  Would like to continue with the current doses of medication.    Depression Screen done today and results listed below:     10/25/2023   10:23 AM 09/20/2023    9:27 AM 08/18/2023   10:17 AM 07/04/2023    9:10 AM 01/03/2023   10:53 AM  Depression screen PHQ 2/9  Decreased Interest 2 2 2 1 2   Down, Depressed, Hopeless 2 2  2 2   PHQ - 2 Score 4 4 2 3 4   Altered sleeping 1 3 2 2 1   Tired, decreased energy 2 3 2 2 2   Change in appetite 2 2 1 2 3   Feeling bad or failure about yourself  0 1 3 2  0  Trouble concentrating 0 0 1 0 0  Moving slowly or fidgety/restless 0 0 2 0 0  Suicidal thoughts 0 0 0 0 1  PHQ-9 Score 9 13 13 11 11   Difficult doing work/chores Not difficult at all Not difficult at all Somewhat difficult Somewhat difficult Somewhat difficult    The patient does not have a history of falls. I did complete a risk assessment for falls. A plan of care for falls was documented.   Past Medical History:  Past Medical History:  Diagnosis Date   Allergy    Seasonal   Complication of anesthesia    PT WOKE UP FROM TONSILLECTOMY VERY COMBATIVE PER MOM-PT WAS AGE 34   Family history of adverse reaction to anesthesia    MOM-N/V   Headache    MIGRAINES    Surgical History:  Past Surgical History:  Procedure Laterality Date   CAUTERIZE  INNER NOSE     X4   EXCISION MASS LOWER EXTREMETIES Left 06/24/2016   Procedure: EXCISION MASS LOWER EXTREMETIES WITH BIOPSIES;  Surgeon: Carlin Pastel, MD;  Location: [facility] ORS;  Service: General;  Laterality: Left;   TONSILLECTOMY     AGE 34   TYMPANOSTOMY TUBE PLACEMENT      Medications:  Current Outpatient Medications on File Prior to Visit  Medication Sig   buPROPion  (WELLBUTRIN  XL) 300 MG 24 hr tablet Take 1 tablet (300 mg total) by mouth daily.   busPIRone  (BUSPAR ) 10 MG tablet Take 1 tablet (10 mg total) by mouth 2 (two) times daily as needed.   citalopram  (CELEXA ) 40 MG tablet Take 1 tablet (40 mg total) by mouth daily.   gabapentin (NEURONTIN) 300 MG capsule Take 300 mg by mouth 3 (three) times daily.   ibuprofen (ADVIL,MOTRIN) 200 MG tablet Take 200 mg by mouth every 6 (six) hours as needed for headache or moderate pain.   traZODone  (DESYREL ) 50 MG tablet Take 1 tablet (50 mg total) by mouth at bedtime.   No current facility-administered medications on  file prior to visit.    Allergies:  No Known Allergies  Social History:  Social History   Socioeconomic History   Marital status: Single    Spouse name: Not on file   Number of children: Not on file   Years of education: Not on file   Highest education level: Not on file  Occupational History   Not on file  Tobacco Use   Smoking status: Never    Passive exposure: Yes   Smokeless tobacco: Never   Tobacco comments:    Mom and dad smoke outside  Vaping Use   Vaping status: Never Used  Substance and Sexual Activity   Alcohol use: No    Alcohol/week: 0.0 standard drinks of alcohol   Drug use: No   Sexual activity: Never  Other Topics Concern   Not on file  Social History Narrative   Not on file   Social Drivers of Health   Financial Resource Strain: Not on file  Food Insecurity: Not on file  Transportation Needs: Not on file  Physical Activity: Not on file  Stress: Not on file  Social Connections: Not  on file  Intimate Partner Violence: Not on file   Social History   Tobacco Use  Smoking Status Never   Passive exposure: Yes  Smokeless Tobacco Never  Tobacco Comments   Mom and dad smoke outside   Social History   Substance and Sexual Activity  Alcohol Use No   Alcohol/week: 0.0 standard drinks of alcohol    Family History:  Family History  Problem Relation Age of Onset   Migraines Mother    Migraines Father     Past medical history, surgical history, medications, allergies, family history and social history reviewed with patient today and changes made to appropriate areas of the chart.   Review of Systems  Psychiatric/Behavioral:  Positive for depression. Negative for suicidal ideas. The patient is nervous/anxious.    All other ROS negative except what is listed above and in the HPI.      Objective:    BP 125/86   Pulse 82   Temp (!) 97.3 F (36.3 C) (Oral)   Wt 200 lb 9.6 oz (91 kg)   LMP 10/21/2023 (Approximate)   SpO2 98%   BMI 35.53 kg/m   Wt Readings from Last 3 Encounters:  10/25/23 200 lb 9.6 oz (91 kg)  09/20/23 199 lb (90.3 kg)  08/18/23 194 lb 9.6 oz (88.3 kg)    Physical Exam Vitals and nursing note reviewed.  Constitutional:      General: She is awake. She is not in acute distress.    Appearance: Normal appearance. She is well-developed. She is not ill-appearing.  HENT:     Head: Normocephalic and atraumatic.     Right Ear: Hearing, tympanic membrane, ear canal and external ear normal. No drainage.     Left Ear: Hearing, tympanic membrane, ear canal and external ear normal. No drainage.     Nose: Nose normal.     Right Sinus: No maxillary sinus tenderness or frontal sinus tenderness.     Left Sinus: No maxillary sinus tenderness or frontal sinus tenderness.     Mouth/Throat:     Mouth: Mucous membranes are moist.     Pharynx: Oropharynx is clear. Uvula midline. No pharyngeal swelling, oropharyngeal exudate or posterior oropharyngeal  erythema.  Eyes:     General: Lids are normal.        Right eye: No discharge.  Left eye: No discharge.     Extraocular Movements: Extraocular movements intact.     Conjunctiva/sclera: Conjunctivae normal.     Pupils: Pupils are equal, round, and reactive to light.     Visual Fields: Right eye visual fields normal and left eye visual fields normal.  Neck:     Thyroid : No thyromegaly.     Vascular: No carotid bruit.     Trachea: Trachea normal.  Cardiovascular:     Rate and Rhythm: Normal rate and regular rhythm.     Heart sounds: Normal heart sounds. No murmur heard.    No gallop.  Pulmonary:     Effort: Pulmonary effort is normal. No accessory muscle usage or respiratory distress.     Breath sounds: Normal breath sounds.  Chest:  Breasts:    Right: Normal.     Left: Normal.  Abdominal:     General: Bowel sounds are normal.     Palpations: Abdomen is soft. There is no hepatomegaly or splenomegaly.     Tenderness: There is no abdominal tenderness.  Musculoskeletal:        General: Normal range of motion.     Cervical back: Normal range of motion and neck supple.     Right lower leg: No edema.     Left lower leg: No edema.  Lymphadenopathy:     Head:     Right side of head: No submental, submandibular, tonsillar, preauricular or posterior auricular adenopathy.     Left side of head: No submental, submandibular, tonsillar, preauricular or posterior auricular adenopathy.     Cervical: No cervical adenopathy.     Upper Body:     Right upper body: No supraclavicular, axillary or pectoral adenopathy.     Left upper body: No supraclavicular, axillary or pectoral adenopathy.  Skin:    General: Skin is warm and dry.     Capillary Refill: Capillary refill takes less than 2 seconds.     Findings: No rash.  Neurological:     Mental Status: She is alert and oriented to person, place, and time.     Gait: Gait is intact.  Psychiatric:        Attention and Perception: Attention  normal.        Mood and Affect: Mood normal.        Speech: Speech normal.        Behavior: Behavior normal. Behavior is cooperative.        Thought Content: Thought content normal.        Judgment: Judgment normal.     Results for orders placed or performed in visit on 06/03/22  Microscopic Examination   Collection Time: 06/03/22  9:12 AM   Urine  Result Value Ref Range   WBC, UA 6-10 (A) 0 - 5 /hpf   RBC, Urine 0-2 0 - 2 /hpf   Epithelial Cells (non renal) 0-10 0 - 10 /hpf   Bacteria, UA Many (A) None seen/Few  Urinalysis, Routine w reflex microscopic   Collection Time: 06/03/22  9:12 AM  Result Value Ref Range   Specific Gravity, UA >1.030 (H) 1.005 - 1.030   pH, UA 5.0 5.0 - 7.5   Color, UA Yellow Yellow   Appearance Ur Cloudy (A) Clear   Leukocytes,UA Trace (A) Negative   Protein,UA Negative Negative/Trace   Glucose, UA Negative Negative   Ketones, UA Negative Negative   RBC, UA 1+ (A) Negative   Bilirubin, UA Negative Negative   Urobilinogen, Ur 0.2 0.2 -  1.0 mg/dL   Nitrite, UA Positive (A) Negative   Microscopic Examination See below:   CBC with Differential/Platelet   Collection Time: 06/03/22  9:16 AM  Result Value Ref Range   WBC 6.6 3.4 - 10.8 x10E3/uL   RBC 4.46 3.77 - 5.28 x10E6/uL   Hemoglobin 14.3 11.1 - 15.9 g/dL   Hematocrit 57.5 65.9 - 46.6 %   MCV 95 79 - 97 fL   MCH 32.1 26.6 - 33.0 pg   MCHC 33.7 31.5 - 35.7 g/dL   RDW 88.2 88.2 - 84.5 %   Platelets 201 150 - 450 x10E3/uL   Neutrophils 72 Not Estab. %   Lymphs 18 Not Estab. %   Monocytes 8 Not Estab. %   Eos 1 Not Estab. %   Basos 1 Not Estab. %   Neutrophils Absolute 4.7 1.4 - 7.0 x10E3/uL   Lymphocytes Absolute 1.2 0.7 - 3.1 x10E3/uL   Monocytes Absolute 0.5 0.1 - 0.9 x10E3/uL   EOS (ABSOLUTE) 0.1 0.0 - 0.4 x10E3/uL   Basophils Absolute 0.1 0.0 - 0.2 x10E3/uL   Immature Granulocytes 0 Not Estab. %   Immature Grans (Abs) 0.0 0.0 - 0.1 x10E3/uL  Comprehensive metabolic panel    Collection Time: 06/03/22  9:16 AM  Result Value Ref Range   Glucose 92 70 - 99 mg/dL   BUN 18 6 - 20 mg/dL   Creatinine, Ser 9.18 0.57 - 1.00 mg/dL   eGFR 892 >40 fO/fpw/8.26   BUN/Creatinine Ratio 22 9 - 23   Sodium 139 134 - 144 mmol/L   Potassium 4.2 3.5 - 5.2 mmol/L   Chloride 106 96 - 106 mmol/L   CO2 18 (L) 20 - 29 mmol/L   Calcium 9.2 8.7 - 10.2 mg/dL   Total Protein 6.3 6.0 - 8.5 g/dL   Albumin 4.4 4.0 - 5.0 g/dL   Globulin, Total 1.9 1.5 - 4.5 g/dL   Albumin/Globulin Ratio 2.3 (H) 1.2 - 2.2   Bilirubin Total 0.3 0.0 - 1.2 mg/dL   Alkaline Phosphatase 84 42 - 106 IU/L   AST 15 0 - 40 IU/L   ALT 19 0 - 32 IU/L  Lipid panel   Collection Time: 06/03/22  9:16 AM  Result Value Ref Range   Cholesterol, Total 128 100 - 169 mg/dL   Triglycerides 73 0 - 89 mg/dL   HDL 46 >60 mg/dL   VLDL Cholesterol Cal 15 5 - 40 mg/dL   LDL Chol Calc (NIH) 67 0 - 109 mg/dL   Chol/HDL Ratio 2.8 0.0 - 4.4 ratio  TSH   Collection Time: 06/03/22  9:16 AM  Result Value Ref Range   TSH 4.430 0.450 - 4.500 uIU/mL  HIV Antibody (routine testing w rflx)   Collection Time: 06/03/22  9:16 AM  Result Value Ref Range   HIV Screen 4th Generation wRfx Non Reactive Non Reactive  Hepatitis C Antibody   Collection Time: 06/03/22  9:16 AM  Result Value Ref Range   Hep C Virus Ab Non Reactive Non Reactive  Urine Culture   Collection Time: 06/03/22  2:51 PM   Specimen: Urine   UR  Result Value Ref Range   Urine Culture, Routine Final report (A)    Organism ID, Bacteria Escherichia coli (A)    Antimicrobial Susceptibility Comment       Assessment & Plan:   Problem List Items Addressed This Visit       Other   Anxiety and depression   Chronic.  Controlled.  Continue with current medication regimen of Wellbutrin , Citalopram  and Buspar .  Labs ordered today.  Return to clinic in 6 months for reevaluation.  Call sooner if concerns arise.        Other Visit Diagnoses       Annual physical exam     -  Primary   Health maintenance reviewe during visit today.  Labs ordered.  Vaccines reviewed.   Relevant Orders   CBC with Differential/Platelet   Comprehensive metabolic panel with GFR   Lipid panel   TSH     Screening for ischemic heart disease       Relevant Orders   Lipid panel     Encounter for pre-employment examination       form completed for patient during visit.   Relevant Orders   QuantiFERON-TB Gold Plus        Follow up plan: Return in about 3 months (around 01/25/2024) for Depression/Anxiety FU.   LABORATORY TESTING:  - Pap smear: not applicable  IMMUNIZATIONS:   - Tdap: Tetanus vaccination status reviewed: last tetanus booster within 10 years. - Influenza: Postponed to flu season - Pneumovax: Not applicable - Prevnar: Not applicable - COVID: Not applicable - HPV: Not applicable - Shingrix vaccine: Not applicable  SCREENING: -Mammogram: Not applicable  - Colonoscopy: Not applicable  - Bone Density: Not applicable  -Hearing Test: Not applicable  -Spirometry: Not applicable   PATIENT COUNSELING:   Advised to take 1 mg of folate supplement per day if capable of pregnancy.   Sexuality: Discussed sexually transmitted diseases, partner selection, use of condoms, avoidance of unintended pregnancy  and contraceptive alternatives.   Advised to avoid cigarette smoking.  I discussed with the patient that most people either abstain from alcohol or drink within safe limits (<=14/week and <=4 drinks/occasion for males, <=7/weeks and <= 3 drinks/occasion for females) and that the risk for alcohol disorders and other health effects rises proportionally with the number of drinks per week and how often a drinker exceeds daily limits.  Discussed cessation/primary prevention of drug use and availability of treatment for abuse.   Diet: Encouraged to adjust caloric intake to maintain  or achieve ideal body weight, to reduce intake of dietary saturated fat and total fat,  to limit sodium intake by avoiding high sodium foods and not adding table salt, and to maintain adequate dietary potassium and calcium preferably from fresh fruits, vegetables, and low-fat dairy products.    stressed the importance of regular exercise  Injury prevention: Discussed safety belts, safety helmets, smoke detector, smoking near bedding or upholstery.   Dental health: Discussed importance of regular tooth brushing, flossing, and dental visits.    NEXT PREVENTATIVE PHYSICAL DUE IN 1 YEAR. Return in about 3 months (around 01/25/2024) for Depression/Anxiety FU.

## 2023-10-25 NOTE — Assessment & Plan Note (Signed)
 Chronic.  Controlled.  Continue with current medication regimen of Wellbutrin , Citalopram  and Buspar .  Labs ordered today.  Return to clinic in 6 months for reevaluation.  Call sooner if concerns arise.

## 2023-10-28 ENCOUNTER — Ambulatory Visit: Payer: Self-pay | Admitting: Nurse Practitioner

## 2023-10-28 LAB — LIPID PANEL
Chol/HDL Ratio: 3 ratio (ref 0.0–4.4)
Cholesterol, Total: 151 mg/dL (ref 100–199)
HDL: 50 mg/dL (ref >39)
LDL Chol Calc (NIH): 87 mg/dL (ref 0–99)
Triglycerides: 74 mg/dL (ref 0–149)
VLDL Cholesterol Cal: 14 mg/dL (ref 5–40)

## 2023-10-28 LAB — CBC WITH DIFFERENTIAL/PLATELET
Basophils Absolute: 0.1 x10E3/uL (ref 0.0–0.2)
Basos: 1 %
EOS (ABSOLUTE): 0.1 x10E3/uL (ref 0.0–0.4)
Eos: 1 %
Hematocrit: 43.6 % (ref 34.0–46.6)
Hemoglobin: 14.3 g/dL (ref 11.1–15.9)
Immature Grans (Abs): 0 x10E3/uL (ref 0.0–0.1)
Immature Granulocytes: 0 %
Lymphocytes Absolute: 1.4 x10E3/uL (ref 0.7–3.1)
Lymphs: 26 %
MCH: 31.4 pg (ref 26.6–33.0)
MCHC: 32.8 g/dL (ref 31.5–35.7)
MCV: 96 fL (ref 79–97)
Monocytes Absolute: 0.6 x10E3/uL (ref 0.1–0.9)
Monocytes: 10 %
Neutrophils Absolute: 3.3 x10E3/uL (ref 1.4–7.0)
Neutrophils: 61 %
Platelets: 226 x10E3/uL (ref 150–450)
RBC: 4.55 x10E6/uL (ref 3.77–5.28)
RDW: 12.1 % (ref 11.7–15.4)
WBC: 5.4 x10E3/uL (ref 3.4–10.8)

## 2023-10-28 LAB — COMPREHENSIVE METABOLIC PANEL WITH GFR
ALT: 20 IU/L (ref 0–32)
AST: 14 IU/L (ref 0–40)
Albumin: 4.6 g/dL (ref 4.0–5.0)
Alkaline Phosphatase: 73 IU/L (ref 42–106)
BUN/Creatinine Ratio: 22 (ref 9–23)
BUN: 20 mg/dL (ref 6–20)
Bilirubin Total: 0.4 mg/dL (ref 0.0–1.2)
CO2: 21 mmol/L (ref 20–29)
Calcium: 9.8 mg/dL (ref 8.7–10.2)
Chloride: 106 mmol/L (ref 96–106)
Creatinine, Ser: 0.93 mg/dL (ref 0.57–1.00)
Globulin, Total: 2.2 g/dL (ref 1.5–4.5)
Glucose: 85 mg/dL (ref 70–99)
Potassium: 5.1 mmol/L (ref 3.5–5.2)
Sodium: 140 mmol/L (ref 134–144)
Total Protein: 6.8 g/dL (ref 6.0–8.5)
eGFR: 90 mL/min/1.73 (ref >59)

## 2023-10-28 LAB — QUANTIFERON-TB GOLD PLUS
QuantiFERON Mitogen Value: >10 [IU]/mL
QuantiFERON Nil Value: 0 [IU]/mL
QuantiFERON TB1 Ag Value: 0 [IU]/mL
QuantiFERON TB2 Ag Value: 0 [IU]/mL

## 2023-10-28 LAB — TSH: TSH: 2.88 u[IU]/mL (ref 0.450–4.500)

## 2024-01-26 ENCOUNTER — Encounter: Payer: Self-pay | Admitting: Nurse Practitioner

## 2024-01-26 ENCOUNTER — Ambulatory Visit: Admitting: Nurse Practitioner

## 2024-01-26 VITALS — BP 110/76 | HR 90 | Temp 100.1°F | Ht 62.99 in | Wt 209.8 lb

## 2024-01-26 DIAGNOSIS — F32A Depression, unspecified: Secondary | ICD-10-CM

## 2024-01-26 DIAGNOSIS — F419 Anxiety disorder, unspecified: Secondary | ICD-10-CM

## 2024-01-26 MED ORDER — TRAZODONE HCL 50 MG PO TABS: 50.0000 mg | ORAL_TABLET | Freq: Every day | ORAL | 1 refills | Status: AC

## 2024-01-26 MED ORDER — BUPROPION HCL ER (XL) 300 MG PO TB24: 300.0000 mg | ORAL_TABLET | Freq: Every day | ORAL | 1 refills | Status: AC

## 2024-01-26 MED ORDER — BUSPIRONE HCL 10 MG PO TABS: 10.0000 mg | ORAL_TABLET | Freq: Two times a day (BID) | ORAL | 1 refills | Status: AC | PRN

## 2024-01-26 MED ORDER — CITALOPRAM HYDROBROMIDE 40 MG PO TABS: 40.0000 mg | ORAL_TABLET | Freq: Every day | ORAL | 1 refills | Status: AC

## 2024-01-26 NOTE — Progress Notes (Signed)
 BP 110/76 (BP Location: Right Arm, Patient Position: Sitting, Cuff Size: Large)   Pulse 90   Temp 100.1 F (37.8 C) (Oral)   Ht 5' 2.99 (1.6 m)   Wt 209 lb 12.8 oz (95.2 kg)   LMP 01/09/2024 (Approximate)   SpO2 98%   BMI 37.17 kg/m    Subjective:    Patient ID: Katherine Ibarra, female    DOB: 2001-01-22, 23 y.o.   MRN: [id number]  HPI: Katherine Ibarra is a 23 y.o. female  Chief Complaint  Patient presents with   Depression   DEPRESSION/ANXIETY Patient states she is doing much better with her mood.  States the Wellbutrin  and Citalopram  are working well for her.  She feels like the Buspar  is a good dose for her.  Would like to continue with the current doses of medication. She is having some panic attacks but doesn't want to increase her medication.   Flowsheet Row Office Visit from 01/26/2024 in [facility]  PHQ-9 Total Score 10      01/26/2024    9:25 AM 10/25/2023   10:23 AM 09/20/2023    9:28 AM 08/18/2023   10:17 AM  GAD 7 : Generalized Anxiety Score  Nervous, Anxious, on Edge 2 2 3 2   Control/stop worrying 2 2 3 3   Worry too much - different things 2 2 3 3   Trouble relaxing 2 2 2 2   Restless 1 2 2 2   Easily annoyed or irritable 2 2 3 3   Afraid - awful might happen 1 2 2 2   Total GAD 7 Score 12 14 18 17   Anxiety Difficulty Somewhat difficult Not difficult at all Not difficult at all Somewhat difficult       Relevant past medical, surgical, family and social history reviewed and updated as indicated. Interim medical history since our last visit reviewed. Allergies and medications reviewed and updated.  Review of Systems  Psychiatric/Behavioral:  Positive for dysphoric mood. Negative for suicidal ideas. The patient is nervous/anxious.     Per HPI unless specifically indicated above     Objective:    BP 110/76 (BP Location: Right Arm, Patient Position: Sitting, Cuff Size: Large)   Pulse 90   Temp 100.1 F (37.8 C) (Oral)   Ht 5'  2.99 (1.6 m)   Wt 209 lb 12.8 oz (95.2 kg)   LMP 01/09/2024 (Approximate)   SpO2 98%   BMI 37.17 kg/m   Wt Readings from Last 3 Encounters:  01/26/24 209 lb 12.8 oz (95.2 kg)  10/25/23 200 lb 9.6 oz (91 kg)  09/20/23 199 lb (90.3 kg)    Physical Exam Vitals and nursing note reviewed.  Constitutional:      General: She is not in acute distress.    Appearance: Normal appearance. She is normal weight. She is not ill-appearing, toxic-appearing or diaphoretic.  HENT:     Head: Normocephalic.     Right Ear: External ear normal.     Left Ear: External ear normal.     Nose: Nose normal.     Mouth/Throat:     Mouth: Mucous membranes are moist.     Pharynx: Oropharynx is clear.  Eyes:     General:        Right eye: No discharge.        Left eye: No discharge.     Extraocular Movements: Extraocular movements intact.     Conjunctiva/sclera: Conjunctivae normal.     Pupils: Pupils are equal, round,  and reactive to light.  Cardiovascular:     Rate and Rhythm: Normal rate and regular rhythm.     Heart sounds: No murmur heard. Pulmonary:     Effort: Pulmonary effort is normal. No respiratory distress.     Breath sounds: Normal breath sounds. No wheezing or rales.  Musculoskeletal:     Cervical back: Normal range of motion and neck supple.  Skin:    General: Skin is warm and dry.     Capillary Refill: Capillary refill takes less than 2 seconds.  Neurological:     General: No focal deficit present.     Mental Status: She is alert and oriented to person, place, and time. Mental status is at baseline.  Psychiatric:        Mood and Affect: Mood normal.        Behavior: Behavior normal.        Thought Content: Thought content normal.        Judgment: Judgment normal.     Results for orders placed or performed in visit on 10/25/23  CBC with Differential/Platelet   Collection Time: 10/25/23 10:52 AM  Result Value Ref Range   WBC 5.4 3.4 - 10.8 x10E3/uL   RBC 4.55 3.77 - 5.28  x10E6/uL   Hemoglobin 14.3 11.1 - 15.9 g/dL   Hematocrit 56.3 65.9 - 46.6 %   MCV 96 79 - 97 fL   MCH 31.4 26.6 - 33.0 pg   MCHC 32.8 31.5 - 35.7 g/dL   RDW 87.8 88.2 - 84.5 %   Platelets 226 150 - 450 x10E3/uL   Neutrophils 61 Not Estab. %   Lymphs 26 Not Estab. %   Monocytes 10 Not Estab. %   Eos 1 Not Estab. %   Basos 1 Not Estab. %   Neutrophils Absolute 3.3 1.4 - 7.0 x10E3/uL   Lymphocytes Absolute 1.4 0.7 - 3.1 x10E3/uL   Monocytes Absolute 0.6 0.1 - 0.9 x10E3/uL   EOS (ABSOLUTE) 0.1 0.0 - 0.4 x10E3/uL   Basophils Absolute 0.1 0.0 - 0.2 x10E3/uL   Immature Granulocytes 0 Not Estab. %   Immature Grans (Abs) 0.0 0.0 - 0.1 x10E3/uL  Comprehensive metabolic panel with GFR   Collection Time: 10/25/23 10:52 AM  Result Value Ref Range   Glucose 85 70 - 99 mg/dL   BUN 20 6 - 20 mg/dL   Creatinine, Ser 9.06 0.57 - 1.00 mg/dL   eGFR 90 >40 fO/fpw/8.26   BUN/Creatinine Ratio 22 9 - 23   Sodium 140 134 - 144 mmol/L   Potassium 5.1 3.5 - 5.2 mmol/L   Chloride 106 96 - 106 mmol/L   CO2 21 20 - 29 mmol/L   Calcium 9.8 8.7 - 10.2 mg/dL   Total Protein 6.8 6.0 - 8.5 g/dL   Albumin 4.6 4.0 - 5.0 g/dL   Globulin, Total 2.2 1.5 - 4.5 g/dL   Bilirubin Total 0.4 0.0 - 1.2 mg/dL   Alkaline Phosphatase 73 42 - 106 IU/L   AST 14 0 - 40 IU/L   ALT 20 0 - 32 IU/L  Lipid panel   Collection Time: 10/25/23 10:52 AM  Result Value Ref Range   Cholesterol, Total 151 100 - 199 mg/dL   Triglycerides 74 0 - 149 mg/dL   HDL 50 >60 mg/dL   VLDL Cholesterol Cal 14 5 - 40 mg/dL   LDL Chol Calc (NIH) 87 0 - 99 mg/dL   Chol/HDL Ratio 3.0 0.0 - 4.4 ratio  TSH  Collection Time: 10/25/23 10:52 AM  Result Value Ref Range   TSH 2.880 0.450 - 4.500 uIU/mL  QuantiFERON-TB Gold Plus   Collection Time: 10/25/23 10:52 AM  Result Value Ref Range   QuantiFERON Incubation Incubation performed.    QuantiFERON Criteria Comment    QuantiFERON TB1 Ag Value 0.00 IU/mL   QuantiFERON TB2 Ag Value 0.00 IU/mL    QuantiFERON Nil Value 0.00 IU/mL   QuantiFERON Mitogen Value >10.00 IU/mL   QuantiFERON-TB Gold Plus Negative Negative      Assessment & Plan:   Problem List Items Addressed This Visit       Other   Anxiety and depression - Primary   Chronic.  Controlled.  Continue with current medication regimen of Wellbutrin , Citalopram  and Buspar .  Okay to take additional dose of Buspar  if she feels a panic attack coming on.  Return to clinic in 3 months for reevaluation.  Call sooner if concerns arise.       Relevant Medications   buPROPion  (WELLBUTRIN  XL) 300 MG 24 hr tablet   busPIRone  (BUSPAR ) 10 MG tablet   citalopram  (CELEXA ) 40 MG tablet   traZODone  (DESYREL ) 50 MG tablet     Follow up plan: Return in about 3 months (around 04/27/2024) for Depression/Anxiety FU.

## 2024-01-26 NOTE — Assessment & Plan Note (Signed)
 Chronic.  Controlled.  Continue with current medication regimen of Wellbutrin , Citalopram  and Buspar .  Okay to take additional dose of Buspar  if she feels a panic attack coming on.  Return to clinic in 3 months for reevaluation.  Call sooner if concerns arise.

## 2024-05-01 ENCOUNTER — Ambulatory Visit: Admitting: Nurse Practitioner
